# Patient Record
Sex: Male | Born: 1990 | Race: White | Hispanic: No | Marital: Married | Smoking: Never smoker
Health system: Southern US, Community
[De-identification: ages and names within clinical notes are randomized; demographics above are authoritative.]

---

## 2011-02-04 ENCOUNTER — Other Ambulatory Visit: Payer: Self-pay | Admitting: Family Medicine

## 2012-04-06 HISTORY — PX: LAPAROSCOPIC APPENDECTOMY: SUR753

## 2012-04-09 ENCOUNTER — Inpatient Hospital Stay: Payer: Self-pay | Admitting: Surgery

## 2012-04-09 ENCOUNTER — Ambulatory Visit: Payer: Self-pay | Admitting: Family Medicine

## 2012-04-09 LAB — CBC WITH DIFFERENTIAL/PLATELET
Basophil #: 0.1 10*3/uL (ref 0.0–0.1)
Eosinophil %: 0.1 %
HCT: 44.3 % (ref 40.0–52.0)
Lymphocyte #: 1.6 10*3/uL (ref 1.0–3.6)
Lymphocyte %: 12.1 %
MCH: 30 pg (ref 26.0–34.0)
MCHC: 34.7 g/dL (ref 32.0–36.0)
Monocyte #: 1.2 x10 3/mm — ABNORMAL HIGH (ref 0.2–1.0)
Neutrophil #: 10.2 10*3/uL — ABNORMAL HIGH (ref 1.4–6.5)
Neutrophil %: 78.2 %
RBC: 5.13 10*6/uL (ref 4.40–5.90)
RDW: 13.1 % (ref 11.5–14.5)

## 2012-04-09 LAB — URINALYSIS, COMPLETE
Glucose,UR: NEGATIVE mg/dL (ref 0–75)
Ketone: NEGATIVE
Ph: 8 (ref 4.5–8.0)

## 2012-04-09 LAB — BASIC METABOLIC PANEL
Anion Gap: 11 (ref 7–16)
BUN: 14 mg/dL (ref 7–18)
EGFR (African American): >60
Glucose: 97 mg/dL (ref 65–99)
Osmolality: 278 (ref 275–301)

## 2012-04-12 LAB — CBC WITH DIFFERENTIAL/PLATELET
Basophil #: 0 10*3/uL (ref 0.0–0.1)
Basophil %: 0.2 %
Eosinophil #: 0.1 10*3/uL (ref 0.0–0.7)
HGB: 12.5 g/dL — ABNORMAL LOW (ref 13.0–18.0)
Lymphocyte #: 0.9 10*3/uL — ABNORMAL LOW (ref 1.0–3.6)
Lymphocyte %: 7.1 %
MCH: 29.6 pg (ref 26.0–34.0)
MCHC: 34.3 g/dL (ref 32.0–36.0)
MCV: 86 fL (ref 80–100)
Neutrophil #: 9.8 10*3/uL — ABNORMAL HIGH (ref 1.4–6.5)
Platelet: 227 10*3/uL (ref 150–440)
RBC: 4.22 10*6/uL — ABNORMAL LOW (ref 4.40–5.90)
RDW: 12.6 % (ref 11.5–14.5)

## 2012-04-14 LAB — PATHOLOGY REPORT

## 2012-04-15 ENCOUNTER — Ambulatory Visit: Payer: Self-pay | Admitting: Surgery

## 2012-04-15 LAB — CBC WITH DIFFERENTIAL/PLATELET
Basophil #: 0 10*3/uL (ref 0.0–0.1)
Eosinophil %: 1.5 %
HCT: 39.8 % — ABNORMAL LOW (ref 40.0–52.0)
Lymphocyte #: 1.6 10*3/uL (ref 1.0–3.6)
MCHC: 33.2 g/dL (ref 32.0–36.0)
MCV: 87 fL (ref 80–100)
Monocyte %: 10.6 %
Neutrophil #: 5.9 10*3/uL (ref 1.4–6.5)
Platelet: 366 10*3/uL (ref 150–440)
RBC: 4.57 10*6/uL (ref 4.40–5.90)
RDW: 12.5 % (ref 11.5–14.5)
WBC: 8.6 10*3/uL (ref 3.8–10.6)

## 2012-09-22 ENCOUNTER — Encounter (HOSPITAL_COMMUNITY): Payer: Self-pay | Admitting: Physical Medicine and Rehabilitation

## 2012-09-22 ENCOUNTER — Inpatient Hospital Stay (HOSPITAL_COMMUNITY)
Admission: EM | Admit: 2012-09-22 | Discharge: 2012-09-26 | DRG: 372 | Disposition: A | Discharge status: Home / Self Care | Payer: 59 | Attending: General Surgery | Admitting: General Surgery

## 2012-09-22 ENCOUNTER — Emergency Department (HOSPITAL_COMMUNITY): Payer: 59

## 2012-09-22 DIAGNOSIS — K651 Peritoneal abscess: Secondary | ICD-10-CM

## 2012-09-22 DIAGNOSIS — D62 Acute posthemorrhagic anemia: Secondary | ICD-10-CM | POA: Diagnosis present

## 2012-09-22 LAB — CBC WITH DIFFERENTIAL/PLATELET
Basophils Absolute: 0 10*3/uL (ref 0.0–0.1)
Basophils Relative: 0 % (ref 0–1)
Eosinophils Absolute: 0 10*3/uL (ref 0.0–0.7)
HCT: 41 % (ref 39.0–52.0)
MCH: 29.5 pg (ref 26.0–34.0)
MCHC: 34.1 g/dL (ref 30.0–36.0)
Monocytes Absolute: 1.8 10*3/uL — ABNORMAL HIGH (ref 0.1–1.0)
Monocytes Relative: 13 % — ABNORMAL HIGH (ref 3–12)
Neutro Abs: 11.4 10*3/uL — ABNORMAL HIGH (ref 1.7–7.7)
Neutrophils Relative %: 82 % — ABNORMAL HIGH (ref 43–77)
RDW: 12.6 % (ref 11.5–15.5)

## 2012-09-22 LAB — URINE MICROSCOPIC-ADD ON

## 2012-09-22 LAB — CBC
Hemoglobin: 12.6 g/dL — ABNORMAL LOW (ref 13.0–17.0)
MCHC: 33.4 g/dL (ref 30.0–36.0)
Platelets: 203 10*3/uL (ref 150–400)
RDW: 12.8 % (ref 11.5–15.5)

## 2012-09-22 LAB — COMPREHENSIVE METABOLIC PANEL
AST: 23 U/L (ref 0–37)
Albumin: 3.7 g/dL (ref 3.5–5.2)
BUN: 15 mg/dL (ref 6–23)
Calcium: 9.2 mg/dL (ref 8.4–10.5)
Chloride: 99 mEq/L (ref 96–112)
Creatinine, Ser: 1.12 mg/dL (ref 0.50–1.35)
Total Bilirubin: 2.2 mg/dL — ABNORMAL HIGH (ref 0.3–1.2)
Total Protein: 7.7 g/dL (ref 6.0–8.3)

## 2012-09-22 LAB — URINALYSIS, ROUTINE W REFLEX MICROSCOPIC
Ketones, ur: 15 mg/dL — AB
Leukocytes, UA: NEGATIVE
Nitrite: NEGATIVE
Protein, ur: 30 mg/dL — AB

## 2012-09-22 LAB — CREATININE, SERUM
Creatinine, Ser: 0.99 mg/dL (ref 0.50–1.35)
GFR calc non Af Amer: >90 mL/min (ref >90)

## 2012-09-22 LAB — RAPID STREP SCREEN (MED CTR MEBANE ONLY): Streptococcus, Group A Screen (Direct): NEGATIVE

## 2012-09-22 LAB — PROTIME-INR: Prothrombin Time: 16.2 seconds — ABNORMAL HIGH (ref 11.6–15.2)

## 2012-09-22 LAB — LIPASE, BLOOD: Lipase: 25 U/L (ref 11–59)

## 2012-09-22 MED ORDER — PIPERACILLIN-TAZOBACTAM 3.375 G IVPB
3.3750 g | Freq: Once | INTRAVENOUS | Status: AC
  Administered 2012-09-22: 3.375 g via INTRAVENOUS
  Filled 2012-09-22: qty 50

## 2012-09-22 MED ORDER — ACETAMINOPHEN 325 MG PO TABS
650.0000 mg | ORAL_TABLET | Freq: Four times a day (QID) | ORAL | Status: DC | PRN
  Administered 2012-09-23 – 2012-09-24 (×2): 650 mg via ORAL
  Filled 2012-09-22 (×2): qty 2

## 2012-09-22 MED ORDER — ONDANSETRON HCL 4 MG/2ML IJ SOLN
4.0000 mg | Freq: Four times a day (QID) | INTRAMUSCULAR | Status: DC | PRN
  Administered 2012-09-22 – 2012-09-23 (×4): 4 mg via INTRAVENOUS
  Filled 2012-09-22 (×4): qty 2

## 2012-09-22 MED ORDER — DIPHENHYDRAMINE HCL 50 MG/ML IJ SOLN
12.5000 mg | Freq: Four times a day (QID) | INTRAMUSCULAR | Status: DC | PRN
  Administered 2012-09-25: 25 mg via INTRAVENOUS
  Filled 2012-09-22: qty 1

## 2012-09-22 MED ORDER — HEPARIN SODIUM (PORCINE) 5000 UNIT/ML IJ SOLN
5000.0000 [IU] | Freq: Three times a day (TID) | INTRAMUSCULAR | Status: DC
  Administered 2012-09-23 – 2012-09-26 (×10): 5000 [IU] via SUBCUTANEOUS
  Filled 2012-09-22 (×15): qty 1

## 2012-09-22 MED ORDER — HYDROMORPHONE HCL PF 1 MG/ML IJ SOLN
1.0000 mg | Freq: Once | INTRAMUSCULAR | Status: AC
  Administered 2012-09-22: 1 mg via INTRAVENOUS
  Filled 2012-09-22: qty 1

## 2012-09-22 MED ORDER — POTASSIUM CHLORIDE IN NACL 20-0.9 MEQ/L-% IV SOLN
INTRAVENOUS | Status: DC
  Administered 2012-09-22 – 2012-09-23 (×2): 125 mL/h via INTRAVENOUS
  Administered 2012-09-23 – 2012-09-25 (×5): via INTRAVENOUS
  Administered 2012-09-25: 1000 mL via INTRAVENOUS
  Filled 2012-09-22 (×12): qty 1000

## 2012-09-22 MED ORDER — ONDANSETRON HCL 4 MG/2ML IJ SOLN
4.0000 mg | Freq: Once | INTRAMUSCULAR | Status: AC
  Administered 2012-09-22: 4 mg via INTRAVENOUS
  Filled 2012-09-22: qty 2

## 2012-09-22 MED ORDER — PIPERACILLIN-TAZOBACTAM 3.375 G IVPB
3.3750 g | Freq: Three times a day (TID) | INTRAVENOUS | Status: DC
  Administered 2012-09-22 – 2012-09-26 (×11): 3.375 g via INTRAVENOUS
  Filled 2012-09-22 (×17): qty 50

## 2012-09-22 MED ORDER — HYDROMORPHONE HCL PF 1 MG/ML IJ SOLN
0.5000 mg | INTRAMUSCULAR | Status: DC | PRN
  Administered 2012-09-22 – 2012-09-24 (×5): 1 mg via INTRAVENOUS
  Filled 2012-09-22 (×5): qty 1

## 2012-09-22 MED ORDER — ACETAMINOPHEN 10 MG/ML IV SOLN
1000.0000 mg | Freq: Four times a day (QID) | INTRAVENOUS | Status: AC
  Administered 2012-09-22 – 2012-09-23 (×4): 1000 mg via INTRAVENOUS
  Filled 2012-09-22 (×4): qty 100

## 2012-09-22 MED ORDER — SODIUM CHLORIDE 0.9 % IV BOLUS (SEPSIS)
1000.0000 mL | Freq: Once | INTRAVENOUS | Status: AC
  Administered 2012-09-22: 1000 mL via INTRAVENOUS

## 2012-09-22 MED ORDER — DIPHENHYDRAMINE HCL 12.5 MG/5ML PO ELIX
12.5000 mg | ORAL_SOLUTION | Freq: Four times a day (QID) | ORAL | Status: DC | PRN
  Filled 2012-09-22: qty 10

## 2012-09-22 MED ORDER — ACETAMINOPHEN 650 MG RE SUPP
650.0000 mg | Freq: Four times a day (QID) | RECTAL | Status: DC | PRN
  Filled 2012-09-22: qty 1

## 2012-09-22 MED ORDER — IOHEXOL 300 MG/ML  SOLN
100.0000 mL | Freq: Once | INTRAMUSCULAR | Status: AC | PRN
  Administered 2012-09-22: 100 mL via INTRAVENOUS

## 2012-09-22 NOTE — H&P (Signed)
Nathan Norman is an 21 y.o. male.   Chief Complaint: RLQ abdominal pain, nausea, vomiting, sore throat and headache progressive over the last 3 days HPI: Patient is a healthy 21 year old white male who was in good health until 09/19/12. He developed a headache sore throat and some abdominal discomfort. He also had myalgias and thought he had a flu-type bug.  He's also been working out and thought some of the myalgias related to exercise. He was able to eat on Saturday and Sunday, but started running a low-grade fevers, recorded at home up to 101. He's been taking Motrin for his myalgias and headaches without any relief. He is also continued to have a sore throat, and progressive discomfort in his right lower quadrant. He woke up this morning with nausea, vomiting, and worsening abdominal pain. He reports passing out or having some type of vagal like episode while vomiting this AM.  He reports passing out with blood draw in July.  He ultimately presented to the emergency room today with abdominal pain, he can barely walk or move, headache, nausea and vomiting.  Last BM earlier today with normal appearance.  Labs show an elevated WBC, elevated Bilirubin, and rim enhancing fluid collection right mid abdomen with extensive inflammatory changes; fluid collection is 2.8 x 2.4 x 1.6 cm. There is also a loop of ileum that could represent an infected Meckel's diverticulum.  He had an appendectomy in July 2013 which apparently ruptured as it was being removed.  He had 5 days of IV antibiotics, and after 2 weeks at home did well with no issues till last weekend.  We are ask to see in the ER.  History reviewed. No pertinent past medical history. None  No past surgical history on file. Appendectomy, July 2013 at [facility] in [place], [place]. History reviewed. No pertinent family history. Social History:  reports that he has never smoked. He does not have any smokeless tobacco history on file. He reports that he  does not drink alcohol or use illicit drugs.ETOH: social Drugs; none Tobacco: none  He works and goes to school, lives at home.  Play lacrosse regularly  Allergies: No Known Allergies Prior to Admission medications   Medication Sig Start Date End Date Taking? Authorizing Provider  ibuprofen (ADVIL,MOTRIN) 200 MG tablet Take 400 mg by mouth every 6 (six) hours as needed. For fever   Yes Historical Provider, MD  Ibuprofen-Diphenhydramine Cit (MOTRIN PM PO) Take 2 tablets by mouth once.   Yes Historical Provider, MD      (Not in a hospital admission)  Results for orders placed during the hospital encounter of 09/22/12 (from the past 48 hour(s))  URINALYSIS, ROUTINE W REFLEX MICROSCOPIC     Status: Abnormal   Collection Time   09/22/12 12:05 PM      Component Value Range Comment   Color, Urine AMBER (*) YELLOW BIOCHEMICALS MAY BE AFFECTED BY COLOR   APPearance CLEAR  CLEAR    Specific Gravity, Urine 1.023  1.005 - 1.030    pH 7.5  5.0 - 8.0    Glucose, UA NEGATIVE  NEGATIVE mg/dL    Hgb urine dipstick NEGATIVE  NEGATIVE    Bilirubin Urine SMALL (*) NEGATIVE    Ketones, ur 15 (*) NEGATIVE mg/dL    Protein, ur 30 (*) NEGATIVE mg/dL    Urobilinogen, UA 1.0  0.0 - 1.0 mg/dL    Nitrite NEGATIVE  NEGATIVE    Leukocytes, UA NEGATIVE  NEGATIVE   URINE MICROSCOPIC-ADD ON  Status: Normal   Collection Time   09/22/12 12:05 PM      Component Value Range Comment   Squamous Epithelial / LPF RARE  RARE    WBC, UA 0-2  <3 WBC/hpf    RBC / HPF 0-2  <3 RBC/hpf    Bacteria, UA RARE  RARE   CBC WITH DIFFERENTIAL     Status: Abnormal   Collection Time   09/22/12 12:38 PM      Component Value Range Comment   WBC 14.0 (*) 4.0 - 10.5 K/uL    RBC 4.75  4.22 - 5.81 MIL/uL    Hemoglobin 14.0  13.0 - 17.0 g/dL    HCT 11.9  14.7 - 82.9 %    MCV 86.3  78.0 - 100.0 fL    MCH 29.5  26.0 - 34.0 pg    MCHC 34.1  30.0 - 36.0 g/dL    RDW 56.2  13.0 - 86.5 %    Platelets 223  150 - 400 K/uL     Neutrophils Relative 82 (*) 43 - 77 %    Neutro Abs 11.4 (*) 1.7 - 7.7 K/uL    Lymphocytes Relative 6 (*) 12 - 46 %    Lymphs Abs 0.8  0.7 - 4.0 K/uL    Monocytes Relative 13 (*) 3 - 12 %    Monocytes Absolute 1.8 (*) 0.1 - 1.0 K/uL    Eosinophils Relative 0  0 - 5 %    Eosinophils Absolute 0.0  0.0 - 0.7 K/uL    Basophils Relative 0  0 - 1 %    Basophils Absolute 0.0  0.0 - 0.1 K/uL   COMPREHENSIVE METABOLIC PANEL     Status: Abnormal   Collection Time   09/22/12 12:38 PM      Component Value Range Comment   Sodium 137  135 - 145 mEq/L    Potassium 4.2  3.5 - 5.1 mEq/L    Chloride 99  96 - 112 mEq/L    CO2 26  19 - 32 mEq/L    Glucose, Bld 134 (*) 70 - 99 mg/dL    BUN 15  6 - 23 mg/dL    Creatinine, Ser 7.84  0.50 - 1.35 mg/dL    Calcium 9.2  8.4 - 69.6 mg/dL    Total Protein 7.7  6.0 - 8.3 g/dL    Albumin 3.7  3.5 - 5.2 g/dL    AST 23  0 - 37 U/L    ALT 24  0 - 53 U/L    Alkaline Phosphatase 65  39 - 117 U/L    Total Bilirubin 2.2 (*) 0.3 - 1.2 mg/dL    GFR calc non Af Amer >90  >90 mL/min    GFR calc Af Amer >90  >90 mL/min   LIPASE, BLOOD     Status: Normal   Collection Time   09/22/12 12:38 PM      Component Value Range Comment   Lipase 25  11 - 59 U/L    Ct Abdomen Pelvis W Contrast  09/22/2012  *RADIOLOGY REPORT*  Clinical Data: Right lower quadrant pain, nausea and vomiting. Fever.  CT ABDOMEN AND PELVIS WITH CONTRAST  Technique:  Multidetector CT imaging of the abdomen and pelvis was performed following the standard protocol during bolus administration of intravenous contrast.  Contrast: OMNIPAQUE IOHEXOL 300 MG/ML  SOLN  Comparison: None.  Findings: Lung bases are clear.  No pleural or pericardial effusion.  The gallbladder, liver,  spleen, adrenal glands, pancreas and kidneys all appear normal.  There is a rim enhancing fluid collection in the right mid abdomen with extensive surrounding inflammatory change.  The collection measures approximately 2.8 cm  cranial-caudal by 2.4 cm AP by 1.6 cm transverse.  The collection may be contiguous with a loop of ileum and could represent an infected Meckel's diverticulum. Additional differential consideration has infection of the omental infarct. The appendix has been removed.  There is no lymphadenopathy.  No focal bony abnormalities identified.  IMPRESSION: Infected collection in the right lower quadrant could be secondary to an infected Meckel's diverticulum.  Additional differential considerations include infection of omental infarct or hematoma.   Original Report Authenticated By: Holley Dexter, M.D.     Review of Systems  Constitutional: Positive for fever and malaise/fatigue. Negative for chills, weight loss and diaphoresis.  HENT: Negative.        Headache for 3 days with onset of sx on Sat. Sore throat for 3 days  Eyes: Negative.   Respiratory: Negative.   Cardiovascular: Negative.   Gastrointestinal: Positive for nausea, vomiting and abdominal pain. Negative for heartburn, diarrhea, blood in stool and melena.       Last BM today, and it was regular.  Genitourinary: Negative.   Musculoskeletal: Positive for myalgias.       Dislocated hip at  lacrosse practice a couple weeks ago.  He has done this before.  Skin: Negative.   Neurological: Positive for loss of consciousness (passed out with nausea and vomiting this AM..  Says he passed out with blood draw in July) and weakness. Negative for dizziness, tingling, tremors, sensory change, speech change, focal weakness and seizures.  Psychiatric/Behavioral: Negative.     Blood pressure 137/69, pulse 88, temperature 99.8 F (37.7 C), temperature source Oral, resp. rate 20, SpO2 98.00%. Physical Exam  Constitutional: He is oriented to person, place, and time. He appears well-developed and well-nourished. He appears distressed (it hurts for him to move just to sit up.).  HENT:  Head: Normocephalic and atraumatic.  Nose: Nose normal.       Some  white stuff on his tongue, does not look like candida  Eyes: Conjunctivae normal and EOM are normal. Pupils are equal, round, and reactive to light. Right eye exhibits no discharge. Left eye exhibits no discharge. No scleral icterus.  Neck: Normal range of motion. Neck supple. No JVD present. No tracheal deviation present.       Full ROM, NO rigidity, No erythema, no nodularity noted on exam  Cardiovascular: Normal rate, regular rhythm, normal heart sounds and intact distal pulses.  Exam reveals no gallop.   No murmur heard. Respiratory: Effort normal and breath sounds normal. No stridor. No respiratory distress. He has no wheezes. He has no rales. He exhibits no tenderness.  GI: Soft. Bowel sounds are normal. He exhibits no distension and no mass. There is tenderness (it hurts for him to move,  pain is primairly in RLQ). There is guarding. There is no rebound.  Musculoskeletal: Normal range of motion. He exhibits no edema and no tenderness.  Lymphadenopathy:    He has no cervical adenopathy.  Neurological: He is alert and oriented to person, place, and time. No cranial nerve deficit.  Skin: Skin is warm and dry. No rash noted. No erythema. No pallor.  Psychiatric: He has a normal mood and affect. His behavior is normal. Judgment and thought content normal.     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  Flu syndrome 3.Appendectomy July 2013,   Plan:  Will review CT with Dr. Johna Sheriff, start antibiotics, tylenol, some ice chips only for now.  Bowel rest, hydration, possible IR evaluation for drainage of fluid collection.  Admit for observation. Check blood cultures also.  Qunisha Bryk 09/22/2012, 5:03 PM

## 2012-09-22 NOTE — ED Notes (Signed)
MD at bedside. 

## 2012-09-22 NOTE — ED Notes (Signed)
Report received from Lauren, RN in [facility].

## 2012-09-22 NOTE — ED Notes (Signed)
CT [facility]) notified that pt was finished with contrast. Ticket to ride placed on pts bed.

## 2012-09-22 NOTE — ED Notes (Signed)
Pt states had appendectomy in May this year. Pt denies urinary symptoms. Pt states pain in abdomen started a few days ago but got worse last night; pt states he is dizzy and lightheaded.Pt denies blurred vision and double vision currently; pt mentating appropriately.

## 2012-09-22 NOTE — ED Provider Notes (Signed)
History     CSN: [id number]  Arrival date & time 09/22/12  1132   First MD Initiated Contact with Patient 09/22/12 1225      Chief Complaint  Patient presents with  . Abdominal Pain  . Nausea  . Emesis    (Consider location/radiation/quality/duration/timing/severity/associated sxs/prior treatment) HPI Comments: Patient is a 21 year old male who presents with a 3 day history of RLQ abdominal pain. The pain is located in RLQ and does radiate to his periumbilical area. The pain is described as sharp and severe. The pain started gradually and progressively worsened since the onset. No alleviating/aggravating factors. The patient has tried motrin for symptoms without relief. Associated symptoms include fever of 102, nausea, and vomiting. Patient denies headache, diarrhea, chest pain, SOB, dysuria, constipation.  Patient is a 21 y.o. male presenting with abdominal pain and vomiting.  Abdominal Pain The primary symptoms of the illness include abdominal pain, fever, nausea and vomiting.  Emesis  Associated symptoms include abdominal pain and a fever.    No past medical history on file.  No past surgical history on file.  History reviewed. No pertinent family history.  History  Substance Use Topics  . Smoking status: Never Smoker   . Smokeless tobacco: Not on file  . Alcohol Use: No      Review of Systems  Constitutional: Positive for fever.  Gastrointestinal: Positive for nausea, vomiting and abdominal pain.  All other systems reviewed and are negative.    Allergies  Review of patient's allergies indicates no known allergies.  Home Medications   Current Outpatient Rx  Name  Route  Sig  Dispense  Refill  . IBUPROFEN 200 MG PO TABS   Oral   Take 400 mg by mouth every 6 (six) hours as needed. For fever         . MOTRIN PM PO   Oral   Take 2 tablets by mouth once.           BP 118/57  Pulse 92  Temp 98.8 F (37.1 C) (Oral)  Resp 18  SpO2 98%  Physical  Exam  Nursing note and vitals reviewed. Constitutional: He is oriented to person, place, and time. He appears well-developed and well-nourished. No distress.  HENT:  Head: Normocephalic and atraumatic.  Mouth/Throat: No oropharyngeal exudate.  Eyes: Conjunctivae normal and EOM are normal. No scleral icterus.  Neck: Normal range of motion. Neck supple.  Cardiovascular: Normal rate and regular rhythm.  Exam reveals no gallop and no friction rub.   No murmur heard. Pulmonary/Chest: Effort normal and breath sounds normal. He has no wheezes. He has no rales. He exhibits no tenderness.  Abdominal: Soft. He exhibits no distension. There is tenderness. There is guarding. There is no rebound.       Extreme tenderness to light palpation of generalized abdomen especially in RUQ and RLQ.  Musculoskeletal: Normal range of motion.  Neurological: He is alert and oriented to person, place, and time. Coordination normal.       Speech is goal-oriented. Moves limbs without ataxia.   Skin: Skin is warm and dry. He is not diaphoretic.  Psychiatric: He has a normal mood and affect. His behavior is normal.    ED Course  Procedures (including critical care time)  Labs Reviewed  URINALYSIS, ROUTINE W REFLEX MICROSCOPIC - Abnormal; Notable for the following:    Color, Urine AMBER (*)  BIOCHEMICALS MAY BE AFFECTED BY COLOR   Bilirubin Urine SMALL (*)  Ketones, ur 15 (*)     Protein, ur 30 (*)     All other components within normal limits  CBC WITH DIFFERENTIAL - Abnormal; Notable for the following:    WBC 14.0 (*)     Neutrophils Relative 82 (*)     Neutro Abs 11.4 (*)     Lymphocytes Relative 6 (*)     Monocytes Relative 13 (*)     Monocytes Absolute 1.8 (*)     All other components within normal limits  COMPREHENSIVE METABOLIC PANEL - Abnormal; Notable for the following:    Glucose, Bld 134 (*)     Total Bilirubin 2.2 (*)     All other components within normal limits  LIPASE, BLOOD  URINE  MICROSCOPIC-ADD ON   Ct Abdomen Pelvis W Contrast  09/22/2012  *RADIOLOGY REPORT*  Clinical Data: Right lower quadrant pain, nausea and vomiting. Fever.  CT ABDOMEN AND PELVIS WITH CONTRAST  Technique:  Multidetector CT imaging of the abdomen and pelvis was performed following the standard protocol during bolus administration of intravenous contrast.  Contrast: OMNIPAQUE IOHEXOL 300 MG/ML  SOLN  Comparison: None.  Findings: Lung bases are clear.  No pleural or pericardial effusion.  The gallbladder, liver, spleen, adrenal glands, pancreas and kidneys all appear normal.  There is a rim enhancing fluid collection in the right mid abdomen with extensive surrounding inflammatory change.  The collection measures approximately 2.8 cm cranial-caudal by 2.4 cm AP by 1.6 cm transverse.  The collection may be contiguous with a loop of ileum and could represent an infected Meckel's diverticulum. Additional differential consideration has infection of the omental infarct. The appendix has been removed.  There is no lymphadenopathy.  No focal bony abnormalities identified.  IMPRESSION: Infected collection in the right lower quadrant could be secondary to an infected Meckel's diverticulum.  Additional differential considerations include infection of omental infarct or hematoma.   Original Report Authenticated By: Holley Dexter, M.D.      No diagnosis found.    MDM  1:19 PM Patient's given fluids, dilaudid, and zofran. Labs show elevated WBC. Patient will have CT abdomen.   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 General surgery consulted and will see the patient for admission.       Emilia Beck, PA-C 09/24/12 1941

## 2012-09-22 NOTE — ED Notes (Signed)
Family at bedside. 

## 2012-09-22 NOTE — ED Notes (Signed)
Pt presents to department for evaluation of RLQ abdominal pain, nausea, vomiting and fever of 102. Ongoing since Saturday, states pain became worse today. 7/10 sharp pain at the time. Denies urinary symptoms. Last BM today was normal. He is conscious alert and oriented x4.

## 2012-09-22 NOTE — H&P (Signed)
Agree with PA-Jennings  Pt s/p appy in July of this year.  Pt with 3-4d h/o RLQ pain and n/v.  Pt also febrile to >101 while at home. CT reveals ?fluid collection and ?Meckle's.   Pt likely with a post op abscess  Will treat him with IV abx for now.  Will d/w IR in the AM in regards to possible perc drain.  Con't NPO for now I discussed the possibilities with the patient and his father and they are in agreement.

## 2012-09-22 NOTE — ED Notes (Signed)
Pt states decrease in pain and nausea. Pt is drinking CT contrast at bedside. Pt denies headache.

## 2012-09-22 NOTE — ED Notes (Signed)
Pt knows that urine is needed 

## 2012-09-23 ENCOUNTER — Inpatient Hospital Stay (HOSPITAL_COMMUNITY): Payer: 59

## 2012-09-23 ENCOUNTER — Encounter (HOSPITAL_COMMUNITY): Payer: Self-pay | Admitting: *Deleted

## 2012-09-23 LAB — COMPREHENSIVE METABOLIC PANEL
BUN: 14 mg/dL (ref 6–23)
CO2: 26 mEq/L (ref 19–32)
Chloride: 100 mEq/L (ref 96–112)
Creatinine, Ser: 1.06 mg/dL (ref 0.50–1.35)
GFR calc Af Amer: >90 mL/min (ref >90)
GFR calc non Af Amer: >90 mL/min (ref >90)
Glucose, Bld: 111 mg/dL — ABNORMAL HIGH (ref 70–99)
Total Bilirubin: 4.2 mg/dL — ABNORMAL HIGH (ref 0.3–1.2)

## 2012-09-23 LAB — CBC
Platelets: 194 10*3/uL (ref 150–400)
RBC: 4.19 MIL/uL — ABNORMAL LOW (ref 4.22–5.81)
RDW: 12.9 % (ref 11.5–15.5)
WBC: 14 10*3/uL — ABNORMAL HIGH (ref 4.0–10.5)

## 2012-09-23 LAB — INFLUENZA PANEL BY PCR (TYPE A & B): H1N1 flu by pcr: NOT DETECTED

## 2012-09-23 MED ORDER — FENTANYL CITRATE 0.05 MG/ML IJ SOLN
INTRAMUSCULAR | Status: AC | PRN
  Administered 2012-09-23: 25 ug via INTRAVENOUS
  Administered 2012-09-23: 50 ug via INTRAVENOUS
  Administered 2012-09-23: 25 ug via INTRAVENOUS

## 2012-09-23 MED ORDER — FENTANYL CITRATE 0.05 MG/ML IJ SOLN
INTRAMUSCULAR | Status: AC
  Filled 2012-09-23: qty 4

## 2012-09-23 MED ORDER — MIDAZOLAM HCL 2 MG/2ML IJ SOLN
INTRAMUSCULAR | Status: AC | PRN
  Administered 2012-09-23 (×2): 1 mg via INTRAVENOUS

## 2012-09-23 MED ORDER — MIDAZOLAM HCL 2 MG/2ML IJ SOLN
INTRAMUSCULAR | Status: AC
  Filled 2012-09-23: qty 4

## 2012-09-23 NOTE — Procedures (Signed)
CT guided aspiration of right abdominal abscess.  Aspirated 10 ml of brown purulent fluid.  Collection was too small for drain placement.  No immediate complication.

## 2012-09-23 NOTE — Interval H&P Note (Cosign Needed)
History and Physical Interval Note: agree with PE findings and ROS as above.  09/23/2012 4:22 PM  Nathan Norman  has presented today for surgery, with the diagnosis of abdominal fluid collection.  The various methods of treatment have been discussed with the patient and family. After consideration of risks, benefits and other options for treatment, the patient has consented to percutaneous aspiration and possible drain placement as a surgical intervention .  The patient's history has been reviewed, patient examined, no change in status, stable for surgery.  I have reviewed the patient's chart and labs.  Questions were answered to the patient's satisfaction.     CAMPBELL,PAMELA D Dr. Richarda Overlie

## 2012-09-23 NOTE — H&P (View-Only) (Signed)
Patient ID: Nathan Norman, male   DOB: Jul 18, 1991, 21 y.o.   MRN: [id number]    Subjective: R side abd pain a little better.  No other C/O  Objective: Vital signs in last 24 hours: Temp:  [98 F (36.7 C)-99.8 F (37.7 C)] 98 F (36.7 C) (12/18 0537) Pulse Rate:  [80-99] 80  (12/18 0537) Resp:  [16-22] 18  (12/18 0537) BP: (118-139)/(54-71) 137/59 mmHg (12/18 0537) SpO2:  [96 %-100 %] 99 % (12/18 0537) Weight:  [215 lb 1.6 oz (97.569 kg)] 215 lb 1.6 oz (97.569 kg) (12/17 2024)    Intake/Output from previous day: 12/17 0701 - 12/18 0700 In: 1493 [I.V.:1493] Out: 451 [Urine:450; Stool:1] Intake/Output this shift:    General appearance: alert, cooperative and no distress GI: abnormal findings:  moderate tenderness in the RLQ  Lab Results:   [facility] 09/23/12 0610 09/22/12 1819  WBC 14.0* 13.2*  HGB 12.3* 12.6*  HCT 36.3* 37.7*  PLT 194 203   BMET  Basename 09/23/12 0610 09/22/12 1819 09/22/12 1238  NA 136 -- 137  K 4.0 -- 4.2  CL 100 -- 99  CO2 26 -- 26  GLUCOSE 111* -- 134*  BUN 14 -- 15  CREATININE 1.06 0.99 --  CALCIUM 8.7 -- 9.2     Studies/Results: Ct Abdomen Pelvis W Contrast  09/22/2012  *RADIOLOGY REPORT*  Clinical Data: Right lower quadrant pain, nausea and vomiting. Fever.  CT ABDOMEN AND PELVIS WITH CONTRAST  Technique:  Multidetector CT imaging of the abdomen and pelvis was performed following the standard protocol during bolus administration of intravenous contrast.  Contrast: OMNIPAQUE IOHEXOL 300 MG/ML  SOLN  Comparison: None.  Findings: Lung bases are clear.  No pleural or pericardial effusion.  The gallbladder, liver, spleen, adrenal glands, pancreas and kidneys all appear normal.  There is a rim enhancing fluid collection in the right mid abdomen with extensive surrounding inflammatory change.  The collection measures approximately 2.8 cm cranial-caudal by 2.4 cm AP by 1.6 cm transverse.  The collection may be contiguous with a loop of ileum and  could represent an infected Meckel's diverticulum. Additional differential consideration has infection of the omental infarct. The appendix has been removed.  There is no lymphadenopathy.  No focal bony abnormalities identified.  IMPRESSION: Infected collection in the right lower quadrant could be secondary to an infected Meckel's diverticulum.  Additional differential considerations include infection of omental infarct or hematoma.   Original Report Authenticated By: Nathan Norman, M.D.    Dg Abd 2 Views  09/23/2012  *RADIOLOGY REPORT*  Clinical Data: Ileus.  Intra-abdominal abscess.  ABDOMEN - 2 VIEW  Comparison: CT abdomen pelvis 09/22/2012.  Findings: Residual oral contrast is seen in the colon, which is normal in caliber.  No small bowel dilatation.  IMPRESSION: Residual oral contrast in the colon.  Otherwise unremarkable bowel gas pattern.   Original Report Authenticated By: Leanna Battles, M.D.     Anti-infectives: Anti-infectives     Start     Dose/Rate Route Frequency Ordered Stop   09/22/12 2200  piperacillin-tazobactam (ZOSYN) IVPB 3.375 g       3.375 g 12.5 mL/hr over 240 Minutes Intravenous 3 times per day 09/22/12 1753     09/22/12 1600  piperacillin-tazobactam (ZOSYN) IVPB 3.375 g       3.375 g 12.5 mL/hr over 240 Minutes Intravenous  Once 09/22/12 1559 09/22/12 2022          Assessment/Plan: Abdominal abscess with Hx of complicated appendectomy in July; likely  related to this IR to evaluate for possible perc drain Stable.  Discussed with pt and father    LOS: 1 day    Nathan Norman 09/23/2012

## 2012-09-23 NOTE — Progress Notes (Signed)
Patient ID: Tremar Burcher, male   DOB: 02/12/1991, 21 y.o.   MRN: [id number]    Subjective: R side abd pain a little better.  No other C/O  Objective: Vital signs in last 24 hours: Temp:  [98 F (36.7 C)-99.8 F (37.7 C)] 98 F (36.7 C) (12/18 0537) Pulse Rate:  [80-99] 80  (12/18 0537) Resp:  [16-22] 18  (12/18 0537) BP: (118-139)/(54-71) 137/59 mmHg (12/18 0537) SpO2:  [96 %-100 %] 99 % (12/18 0537) Weight:  [215 lb 1.6 oz (97.569 kg)] 215 lb 1.6 oz (97.569 kg) (12/17 2024)    Intake/Output from previous day: 12/17 0701 - 12/18 0700 In: 1493 [I.V.:1493] Out: 451 [Urine:450; Stool:1] Intake/Output this shift:    General appearance: alert, cooperative and no distress GI: abnormal findings:  moderate tenderness in the RLQ  Lab Results:   Basename 09/23/12 0610 09/22/12 1819  WBC 14.0* 13.2*  HGB 12.3* 12.6*  HCT 36.3* 37.7*  PLT 194 203   BMET  Basename 09/23/12 0610 09/22/12 1819 09/22/12 1238  NA 136 -- 137  K 4.0 -- 4.2  CL 100 -- 99  CO2 26 -- 26  GLUCOSE 111* -- 134*  BUN 14 -- 15  CREATININE 1.06 0.99 --  CALCIUM 8.7 -- 9.2     Studies/Results: Ct Abdomen Pelvis W Contrast  09/22/2012  *RADIOLOGY REPORT*  Clinical Data: Right lower quadrant pain, nausea and vomiting. Fever.  CT ABDOMEN AND PELVIS WITH CONTRAST  Technique:  Multidetector CT imaging of the abdomen and pelvis was performed following the standard protocol during bolus administration of intravenous contrast.  Contrast: 100mL OMNIPAQUE IOHEXOL 300 MG/ML  SOLN  Comparison: None.  Findings: Lung bases are clear.  No pleural or pericardial effusion.  The gallbladder, liver, spleen, adrenal glands, pancreas and kidneys all appear normal.  There is a rim enhancing fluid collection in the right mid abdomen with extensive surrounding inflammatory change.  The collection measures approximately 2.8 cm cranial-caudal by 2.4 cm AP by 1.6 cm transverse.  The collection may be contiguous with a loop of ileum and  could represent an infected Meckel's diverticulum. Additional differential consideration has infection of the omental infarct. The appendix has been removed.  There is no lymphadenopathy.  No focal bony abnormalities identified.  IMPRESSION: Infected collection in the right lower quadrant could be secondary to an infected Meckel's diverticulum.  Additional differential considerations include infection of omental infarct or hematoma.   Original Report Authenticated By: Thomas D'Alessio, M.D.    Dg Abd 2 Views  09/23/2012  *RADIOLOGY REPORT*  Clinical Data: Ileus.  Intra-abdominal abscess.  ABDOMEN - 2 VIEW  Comparison: CT abdomen pelvis 09/22/2012.  Findings: Residual oral contrast is seen in the colon, which is normal in caliber.  No small bowel dilatation.  IMPRESSION: Residual oral contrast in the colon.  Otherwise unremarkable bowel gas pattern.   Original Report Authenticated By: Melinda Blietz, M.D.     Anti-infectives: Anti-infectives     Start     Dose/Rate Route Frequency Ordered Stop   09/22/12 2200  piperacillin-tazobactam (ZOSYN) IVPB 3.375 g       3.375 g 12.5 mL/hr over 240 Minutes Intravenous 3 times per day 09/22/12 1753     09/22/12 1600  piperacillin-tazobactam (ZOSYN) IVPB 3.375 g       3.375 g 12.5 mL/hr over 240 Minutes Intravenous  Once 09/22/12 1559 09/22/12 2022          Assessment/Plan: Abdominal abscess with Hx of complicated appendectomy in July; likely   related to this IR to evaluate for possible perc drain Stable.  Discussed with pt and father    LOS: 1 day    Lorenz Donley T 09/23/2012   

## 2012-09-24 DIAGNOSIS — R110 Nausea: Secondary | ICD-10-CM

## 2012-09-24 DIAGNOSIS — D62 Acute posthemorrhagic anemia: Secondary | ICD-10-CM

## 2012-09-24 LAB — CBC
MCH: 29.6 pg (ref 26.0–34.0)
MCHC: 34.2 g/dL (ref 30.0–36.0)
MCV: 86.5 fL (ref 78.0–100.0)
Platelets: 216 10*3/uL (ref 150–400)

## 2012-09-24 MED ORDER — HYDROCODONE-ACETAMINOPHEN 5-325 MG PO TABS
0.5000 | ORAL_TABLET | ORAL | Status: DC | PRN
  Administered 2012-09-24 – 2012-09-25 (×7): 2 via ORAL
  Administered 2012-09-26: 1 via ORAL
  Administered 2012-09-26: 2 via ORAL
  Filled 2012-09-24 (×5): qty 2
  Filled 2012-09-24: qty 1
  Filled 2012-09-24 (×2): qty 2
  Filled 2012-09-24 (×2): qty 1

## 2012-09-24 NOTE — Progress Notes (Signed)
Pt denies need for pain medication at this time.

## 2012-09-24 NOTE — Progress Notes (Signed)
Pt states feels much better, that the "pain pills" worked much better than IV pain medicine, and headache is much improved.  Appears more alert, and comfortable.

## 2012-09-24 NOTE — Progress Notes (Signed)
Pt complaining of diarrhea, also of having a severe headache. Father at bedside, took only a few bites of clear liquid breakfast.  Medicated for headache, as states that worst pain now.

## 2012-09-24 NOTE — Progress Notes (Signed)
Patient interviewed and examined, agree with PA note above. As I am seeing the patient this afternoon he clearly feels better. Still some pain but maybe half of yesterday. His father agrees he is doing much better. There is still moderate tenderness in the right lower abdomen but less than yesterday. Continue nonoperative management for now.  Mariella Saa MD, FACS  09/24/2012 6:27 PM

## 2012-09-24 NOTE — Progress Notes (Signed)
Subjective: Patient feeling "crappy" today.  Pt states he has a headache and he's c/o nausea and multiple episodes of diarrhea.  Abdominal pain is better controlled.  Pt tolerating diet okay, but appetite is low.  Pt up ambulating to the BR, now down the halls yet.    Objective: Vital signs in last 24 hours: Temp:  [98 F (36.7 C)-100.2 F (37.9 C)] 98 F (36.7 C) (12/19 0955) Pulse Rate:  [75-102] 75  (12/19 0955) Resp:  [17-24] 20  (12/19 0955) BP: (125-147)/(53-75) 133/53 mmHg (12/19 0955) SpO2:  [96 %-100 %] 96 % (12/19 0955) Last BM Date: 09/23/12  Intake/Output from previous day: 12/18 0701 - 12/19 0700 In: 2677.1 [I.V.:2127.1; IV Piggyback:550] Out: 500 [Urine:500] Intake/Output this shift: Total I/O In: -  Out: 450 [Urine:450]  PE: Gen:  Alert, NAD, pleasant, sweaty Card:  RRR, no M/G/R heard Pulm:  CTA, no W/R/R Abd: Soft, NT/ND, +BS, no HSM, incisions C/D/I   Lab Results:   Basename 09/23/12 0610 09/22/12 1819  WBC 14.0* 13.2*  HGB 12.3* 12.6*  HCT 36.3* 37.7*  PLT 194 203   BMET  Basename 09/23/12 0610 09/22/12 1819 09/22/12 1238  NA 136 -- 137  K 4.0 -- 4.2  CL 100 -- 99  CO2 26 -- 26  GLUCOSE 111* -- 134*  BUN 14 -- 15  CREATININE 1.06 0.99 --  CALCIUM 8.7 -- 9.2   PT/INR  Basename 09/22/12 1819  LABPROT 16.2*  INR 1.33   CMP     Component Value Date/Time   NA 136 09/23/2012 0610   K 4.0 09/23/2012 0610   CL 100 09/23/2012 0610   CO2 26 09/23/2012 0610   GLUCOSE 111* 09/23/2012 0610   BUN 14 09/23/2012 0610   CREATININE 1.06 09/23/2012 0610   CALCIUM 8.7 09/23/2012 0610   PROT 6.6 09/23/2012 0610   ALBUMIN 3.1* 09/23/2012 0610   AST 19 09/23/2012 0610   ALT 16 09/23/2012 0610   ALKPHOS 61 09/23/2012 0610   BILITOT 4.2* 09/23/2012 0610   GFRNONAA >90 09/23/2012 0610   GFRAA >90 09/23/2012 0610   Lipase     Component Value Date/Time   LIPASE 25 09/22/2012 1238       Studies/Results: Ct Abdomen Pelvis W  Contrast  09/22/2012  *RADIOLOGY REPORT*  Clinical Data: Right lower quadrant pain, nausea and vomiting. Fever.  CT ABDOMEN AND PELVIS WITH CONTRAST  Technique:  Multidetector CT imaging of the abdomen and pelvis was performed following the standard protocol during bolus administration of intravenous contrast.  Contrast: OMNIPAQUE IOHEXOL 300 MG/ML  SOLN  Comparison: None.  Findings: Lung bases are clear.  No pleural or pericardial effusion.  The gallbladder, liver, spleen, adrenal glands, pancreas and kidneys all appear normal.  There is a rim enhancing fluid collection in the right mid abdomen with extensive surrounding inflammatory change.  The collection measures approximately 2.8 cm cranial-caudal by 2.4 cm AP by 1.6 cm transverse.  The collection may be contiguous with a loop of ileum and could represent an infected Meckel's diverticulum. Additional differential consideration has infection of the omental infarct. The appendix has been removed.  There is no lymphadenopathy.  No focal bony abnormalities identified.  IMPRESSION: Infected collection in the right lower quadrant could be secondary to an infected Meckel's diverticulum.  Additional differential considerations include infection of omental infarct or hematoma.   Original Report Authenticated By: Holley Dexter, M.D.    Ct Aspiration  09/23/2012  *RADIOLOGY REPORT*  Clinical history:21 year old with  inflammatory process in the right abdominal cavity.  PROCEDURE(S): CT GUIDED ASPIRATION OF RIGHT ABDOMINAL ABSCESS  Physician: Rachelle Hora. Henn, MD  Medications:Versed 2 mg, Fentanyl 100 mcg.   A radiology nurse monitored the patient for moderate sedation.  Moderate sedation time:16 minutes  Fluoroscopy time: 7 seconds CT fluoroscopy  Procedure:The procedure was explained to the patient.  The risks and benefits of the procedure were discussed and the patient's questions were addressed.  Informed consent was obtained from the patient.  CT images of  the abdomen were obtained.  The right upper abdomen was prepped and draped in a sterile fashion.  The skin was anesthetized with lidocaine.  18 gauge needle was directed into the right anterior abdominal fluid collection with CT guidance.  10 ml of brown purulent fluid was removed.  A wire could not be easily advanced into the collection and, therefore, a drain was not placed.  Findings:There is irregular collection in the anterior right upper abdomen, roughly measuring 3.2 cm.  Needle position was confirmed within the lesion.  Brown purulent fluid was removed.  Fluid sent for Gram stain and culture.  Complications: None  Impression:CT guided aspiration of the right anterior abdominal abscess.  10 ml of purulent fluid was removed.  Fluid sent for Gram stain and culture.   Original Report Authenticated By: Richarda Overlie, M.D.    Dg Abd 2 Views  09/23/2012  *RADIOLOGY REPORT*  Clinical Data: Ileus.  Intra-abdominal abscess.  ABDOMEN - 2 VIEW  Comparison: CT abdomen pelvis 09/22/2012.  Findings: Residual oral contrast is seen in the colon, which is normal in caliber.  No small bowel dilatation.  IMPRESSION: Residual oral contrast in the colon.  Otherwise unremarkable bowel gas pattern.   Original Report Authenticated By: Leanna Battles, M.D.     Anti-infectives: Anti-infectives     Start     Dose/Rate Route Frequency Ordered Stop   09/22/12 2200  piperacillin-tazobactam (ZOSYN) IVPB 3.375 g       3.375 g 12.5 mL/hr over 240 Minutes Intravenous 3 times per day 09/22/12 1753     09/22/12 1600  piperacillin-tazobactam (ZOSYN) IVPB 3.375 g       3.375 g 12.5 mL/hr over 240 Minutes Intravenous  Once 09/22/12 1559 09/22/12 2022           Assessment/Plan 21 y/o male s/p IR drainage of abdominal abscess after complicated appendectomy in July 1.  Leukocytosis-will cont to monitor, ordered CBC today 2.  ABL Anemia-mild/stable 3.  Ambulation OOB/IS 4.  Progress diet as tolerated 5.  May be ready to go  home tomorrow? 6.  Father asked about repeat CT scan, told him I would discuss with Dr. Johna Sheriff, but given his improvement he may not need one 7.  Started oral pain meds; oral abx in prep for home?    LOS: 2 days    DORT, Aundra Millet 09/24/2012, 10:59 AM Pager: [phone]

## 2012-09-25 LAB — CBC
HCT: 33 % — ABNORMAL LOW (ref 39.0–52.0)
MCH: 29.2 pg (ref 26.0–34.0)
MCV: 86.8 fL (ref 78.0–100.0)
Platelets: 241 10*3/uL (ref 150–400)
RBC: 3.8 MIL/uL — ABNORMAL LOW (ref 4.22–5.81)

## 2012-09-25 MED ORDER — BIOTENE DRY MOUTH MT LIQD
15.0000 mL | Freq: Two times a day (BID) | OROMUCOSAL | Status: DC
  Administered 2012-09-25 (×2): 15 mL via OROMUCOSAL

## 2012-09-25 NOTE — Progress Notes (Signed)
Patient interviewed and examined, agree with PA note above. Anticipate discharge tomorrow on oral abx Mariella Saa MD, FACS  09/25/2012 2:54 PM

## 2012-09-25 NOTE — Progress Notes (Signed)
Patient ID: Nathan Norman, male   DOB: [birth year], 21 y.o.   MRN: [id number]    Subjective: Pt feels much better today, less pain, +flatus, feels hungry, denies v/n    Objective: Vital signs in last 24 hours: Temp:  [98 F (36.7 C)-99 F (37.2 C)] 98.2 F (36.8 C) (12/20 0540) Pulse Rate:  [72-85] 85  (12/20 0540) Resp:  [18-20] 18  (12/20 0540) BP: (128-144)/(53-62) 131/57 mmHg (12/20 0540) SpO2:  [96 %-100 %] 100 % (12/20 0540) Last BM Date: 09/24/12  Intake/Output from previous day: 12/19 0701 - 12/20 0700 In: 1860 [P.O.:360; I.V.:1500] Out: 450 [Urine:450] Intake/Output this shift:    PE: General:  Awake, aleart, NAD Lungs:  RRR Lungs:  CTA Abd: Soft, NT/ND, +BS, incisions C/D/I   Lab Results:   Basename 09/25/12 0520 09/24/12 1339  WBC 9.5 11.6*  HGB 11.1* 12.5*  HCT 33.0* 36.5*  PLT 241 216   BMET  Basename 09/23/12 0610 09/22/12 1819 09/22/12 1238  NA 136 -- 137  K 4.0 -- 4.2  CL 100 -- 99  CO2 26 -- 26  GLUCOSE 111* -- 134*  BUN 14 -- 15  CREATININE 1.06 0.99 --  CALCIUM 8.7 -- 9.2   PT/INR  Basename 09/22/12 1819  LABPROT 16.2*  INR 1.33   CMP     Component Value Date/Time   NA 136 09/23/2012 0610   K 4.0 09/23/2012 0610   CL 100 09/23/2012 0610   CO2 26 09/23/2012 0610   GLUCOSE 111* 09/23/2012 0610   BUN 14 09/23/2012 0610   CREATININE 1.06 09/23/2012 0610   CALCIUM 8.7 09/23/2012 0610   PROT 6.6 09/23/2012 0610   ALBUMIN 3.1* 09/23/2012 0610   AST 19 09/23/2012 0610   ALT 16 09/23/2012 0610   ALKPHOS 61 09/23/2012 0610   BILITOT 4.2* 09/23/2012 0610   GFRNONAA >90 09/23/2012 0610   GFRAA >90 09/23/2012 0610   Lipase     Component Value Date/Time   LIPASE 25 09/22/2012 1238       Studies/Results: Ct Aspiration  09/23/2012  *RADIOLOGY REPORT*  Clinical history:21 year old with inflammatory process in the right abdominal cavity.  PROCEDURE(S): CT GUIDED ASPIRATION OF RIGHT ABDOMINAL ABSCESS  Physician: Rachelle Hora. Henn, MD   Medications:Versed 2 mg, Fentanyl 100 mcg.   A radiology nurse monitored the patient for moderate sedation.  Moderate sedation time:16 minutes  Fluoroscopy time: 7 seconds CT fluoroscopy  Procedure:The procedure was explained to the patient.  The risks and benefits of the procedure were discussed and the patient's questions were addressed.  Informed consent was obtained from the patient.  CT images of the abdomen were obtained.  The right upper abdomen was prepped and draped in a sterile fashion.  The skin was anesthetized with lidocaine.  18 gauge needle was directed into the right anterior abdominal fluid collection with CT guidance.  10 ml of brown purulent fluid was removed.  A wire could not be easily advanced into the collection and, therefore, a drain was not placed.  Findings:There is irregular collection in the anterior right upper abdomen, roughly measuring 3.2 cm.  Needle position was confirmed within the lesion.  Brown purulent fluid was removed.  Fluid sent for Gram stain and culture.  Complications: None  Impression:CT guided aspiration of the right anterior abdominal abscess.  10 ml of purulent fluid was removed.  Fluid sent for Gram stain and culture.   Original Report Authenticated By: Richarda Overlie, M.D.     Anti-infectives: Anti-infectives  Start     Dose/Rate Route Frequency Ordered Stop   09/22/12 2200   piperacillin-tazobactam (ZOSYN) IVPB 3.375 g        3.375 g 12.5 mL/hr over 240 Minutes Intravenous 3 times per day 09/22/12 1753     09/22/12 1600   piperacillin-tazobactam (ZOSYN) IVPB 3.375 g        3.375 g 12.5 mL/hr over 240 Minutes Intravenous  Once 09/22/12 1559 09/22/12 2022           Assessment/Plan 1. Intraabdominal abscess: 21 y/o male s/p IR drainage of abdominal abscess after complicated appendectomy in July.  Leukocytosis improving, tolerating diet  --recheck wbc tomorrow  --OOB and ambulate  --on zosyn  --diet as torated  --consider converting to PO abx  today and recheck tomorrow.    LOS: 3 days    WHITE, ELIZABETH 09/25/2012, 8:56 AM

## 2012-09-26 LAB — CBC
HCT: 35 % — ABNORMAL LOW (ref 39.0–52.0)
MCH: 28.8 pg (ref 26.0–34.0)
MCHC: 32.9 g/dL (ref 30.0–36.0)
RDW: 12.9 % (ref 11.5–15.5)

## 2012-09-26 MED ORDER — AMOXICILLIN-POT CLAVULANATE 875-125 MG PO TABS: 1.0000 | ORAL_TABLET | Freq: Two times a day (BID) | ORAL | Status: DC

## 2012-09-26 MED ORDER — TRAMADOL HCL 50 MG PO TABS: 50.0000 mg | ORAL_TABLET | Freq: Four times a day (QID) | ORAL | Status: DC | PRN

## 2012-09-26 MED ORDER — VALACYCLOVIR HCL 1 G PO TABS: 1000.0000 mg | ORAL_TABLET | Freq: Two times a day (BID) | ORAL | Status: DC

## 2012-09-26 MED ORDER — HYDROCODONE-ACETAMINOPHEN 5-325 MG PO TABS: 0.5000 | ORAL_TABLET | ORAL | Status: DC | PRN

## 2012-09-26 NOTE — Progress Notes (Signed)
Rash is noted to have gone down and is no longer reddened. Will continue to monitor.

## 2012-09-26 NOTE — Progress Notes (Signed)
Pt c/o rash to chest. Small amount of redness to bilateral upper chest. No SOB/problems breathening, or chest tightening. Previous RN, Okey Regal gave Benadryl IV. Will continue to monitor.

## 2012-09-26 NOTE — Progress Notes (Signed)
  Subjective: Feeling ok. Rash gone. Appetite ok but not back to normal. BM yesterday. No n/v.   Objective: Vital signs in last 24 hours: Temp:  [97.8 F (36.6 C)-99.9 F (37.7 C)] 99 F (37.2 C) (12/21 0537) Pulse Rate:  [66-80] 75  (12/21 0537) Resp:  [18] 18  (12/21 0537) BP: (129-136)/(58-64) 131/64 mmHg (12/21 0537) SpO2:  [98 %-99 %] 98 % (12/21 0537) Weight:  [214 lb 11.2 oz (97.387 kg)] 214 lb 11.2 oz (97.387 kg) (12/20 1500) Last BM Date: 09/25/12  Intake/Output from previous day: 12/20 0701 - 12/21 0700 In: 2466.7 [P.O.:1080; I.V.:1386.7] Out: -  Intake/Output this shift:    Alert, nad cta  Soft, nd, nt. +BS  Lab Results:   Basename 09/26/12 0500 09/25/12 0520  WBC 9.8 9.5  HGB 11.5* 11.1*  HCT 35.0* 33.0*  PLT 285 241   BMET No results found for this basename: NA:2,K:2,CL:2,CO2:2,GLUCOSE:2,BUN:2,CREATININE:2,CALCIUM:2 in the last 72 hours PT/INR No results found for this basename: LABPROT:2,INR:2 in the last 72 hours ABG No results found for this basename: PHART:2,PCO2:2,PO2:2,HCO3:2 in the last 72 hours  Studies/Results: No results found.  Anti-infectives: Anti-infectives     Start     Dose/Rate Route Frequency Ordered Stop   09/22/12 2200   piperacillin-tazobactam (ZOSYN) IVPB 3.375 g        3.375 g 12.5 mL/hr over 240 Minutes Intravenous 3 times per day 09/22/12 1753     09/22/12 1600   piperacillin-tazobactam (ZOSYN) IVPB 3.375 g        3.375 g 12.5 mL/hr over 240 Minutes Intravenous  Once 09/22/12 1559 09/22/12 2022          Assessment/Plan: Intra-abdominal abscess s/p CT guided aspiration.   cx negative to date Afebrile, nml wbc.  Has bowel function  Explained that it may take a while for appetite to fully return. Encouraged small light meals 4-5/day. Also consider boost/ensure.  D/w pt and father d/c instructions Will give rx for cold sore F/u dr Sophronia Simas. Andrey Campanile, MD, FACS General, Bariatric, & Minimally Invasive  Surgery [facility], [place]   LOS: 4 days    Nathan Norman 09/26/2012

## 2012-09-26 NOTE — Progress Notes (Signed)
Brooke Dare to be D/C'd Home per MD order.  Discussed with the patient and all questions fully answered.   Eziah, Negro  Home Medication Instructions ZOX:[id number]   Printed on:09/26/12 1401  Medication Information                    ibuprofen (ADVIL,MOTRIN) 200 MG tablet Take 400 mg by mouth every 6 (six) hours as needed. For fever           Ibuprofen-Diphenhydramine Cit (MOTRIN PM PO) Take 2 tablets by mouth once.           HYDROcodone-acetaminophen (NORCO/VICODIN) 5-325 MG per tablet Take 0.5-2 tablets by mouth every 4 (four) hours as needed (take 1/2 tab for mild pain, 1 tab for moderate pain, and 2 tabs for severe pain).           amoxicillin-clavulanate (AUGMENTIN) 875-125 MG per tablet Take 1 tablet by mouth 2 (two) times daily.           valACYclovir (VALTREX) 1000 MG tablet Take 1 tablet (1,000 mg total) by mouth 2 (two) times daily.           traMADol (ULTRAM) 50 MG tablet Take 1 tablet (50 mg total) by mouth every 6 (six) hours as needed for pain.             VVS, Skin clean, dry and intact without evidence of skin break down, no evidence of skin tears noted. IV catheter discontinued intact. Site without signs and symptoms of complications. Dressing and pressure applied.  An After Visit Summary was printed and given to the patient. Follow up appointments , new prescriptions and medication administration times given. Gave pt discharge instructions for care of incision. Patient escorted via WC, and D/C home via private auto.  Cindra Eves, RN 09/26/2012 2:01 PM

## 2012-09-27 LAB — CULTURE, ROUTINE-ABSCESS: Culture: NO GROWTH

## 2012-09-27 NOTE — ED Provider Notes (Signed)
Medical screening examination/treatment/procedure(s) were conducted as a shared visit with non-physician practitioner(s) and myself.  I personally evaluated the patient during the encounter   Shavon Zenz, MD 09/27/12 0006 

## 2012-09-29 LAB — CULTURE, BLOOD (ROUTINE X 2)

## 2012-10-01 ENCOUNTER — Emergency Department (HOSPITAL_COMMUNITY): Payer: 59

## 2012-10-01 ENCOUNTER — Telehealth (INDEPENDENT_AMBULATORY_CARE_PROVIDER_SITE_OTHER): Payer: Self-pay | Admitting: General Surgery

## 2012-10-01 ENCOUNTER — Emergency Department (HOSPITAL_COMMUNITY)
Admission: EM | Admit: 2012-10-01 | Discharge: 2012-10-01 | Disposition: A | Discharge status: Home / Self Care | Payer: 59 | Attending: Emergency Medicine | Admitting: Emergency Medicine

## 2012-10-01 DIAGNOSIS — J159 Unspecified bacterial pneumonia: Secondary | ICD-10-CM | POA: Insufficient documentation

## 2012-10-01 DIAGNOSIS — J189 Pneumonia, unspecified organism: Secondary | ICD-10-CM

## 2012-10-01 DIAGNOSIS — M549 Dorsalgia, unspecified: Secondary | ICD-10-CM | POA: Insufficient documentation

## 2012-10-01 DIAGNOSIS — R1031 Right lower quadrant pain: Secondary | ICD-10-CM | POA: Insufficient documentation

## 2012-10-01 DIAGNOSIS — L02219 Cutaneous abscess of trunk, unspecified: Secondary | ICD-10-CM | POA: Insufficient documentation

## 2012-10-01 DIAGNOSIS — L03319 Cellulitis of trunk, unspecified: Secondary | ICD-10-CM | POA: Insufficient documentation

## 2012-10-01 LAB — COMPREHENSIVE METABOLIC PANEL
ALT: 49 U/L (ref 0–53)
AST: 26 U/L (ref 0–37)
Albumin: 3.6 g/dL (ref 3.5–5.2)
Alkaline Phosphatase: 88 U/L (ref 39–117)
Calcium: 10 mg/dL (ref 8.4–10.5)
GFR calc Af Amer: >90 mL/min (ref >90)
Potassium: 4.2 mEq/L (ref 3.5–5.1)
Sodium: 137 mEq/L (ref 135–145)
Total Protein: 8.1 g/dL (ref 6.0–8.3)

## 2012-10-01 LAB — CBC WITH DIFFERENTIAL/PLATELET
Basophils Absolute: 0 10*3/uL (ref 0.0–0.1)
Basophils Relative: 0 % (ref 0–1)
Eosinophils Absolute: 0.1 10*3/uL (ref 0.0–0.7)
Eosinophils Relative: 1 % (ref 0–5)
Lymphs Abs: 2.2 10*3/uL (ref 0.7–4.0)
MCH: 29.5 pg (ref 26.0–34.0)
MCHC: 34.2 g/dL (ref 30.0–36.0)
MCV: 86.2 fL (ref 78.0–100.0)
Neutrophils Relative %: 69 % (ref 43–77)
Platelets: 462 10*3/uL — ABNORMAL HIGH (ref 150–400)
RBC: 4.78 MIL/uL (ref 4.22–5.81)
RDW: 12.7 % (ref 11.5–15.5)

## 2012-10-01 MED ORDER — AMOXICILLIN-POT CLAVULANATE 875-125 MG PO TABS: 1.0000 | ORAL_TABLET | Freq: Two times a day (BID) | ORAL | Status: DC

## 2012-10-01 MED ORDER — TRAMADOL HCL 50 MG PO TABS: 50.0000 mg | ORAL_TABLET | Freq: Four times a day (QID) | ORAL | Status: DC | PRN

## 2012-10-01 MED ORDER — AZITHROMYCIN 250 MG PO TABS: 250.0000 mg | ORAL_TABLET | Freq: Every day | ORAL | Status: DC

## 2012-10-01 MED ORDER — OXYCODONE-ACETAMINOPHEN 5-325 MG PO TABS
1.0000 | ORAL_TABLET | Freq: Once | ORAL | Status: AC
  Administered 2012-10-01: 1 via ORAL
  Filled 2012-10-01: qty 1

## 2012-10-01 NOTE — ED Provider Notes (Addendum)
History  This chart was scribed for Nathan Racer, MD by Nathan Norman, ED Scribe. This patient was seen in room TR08C/TR08C and the patient's care was started at 1:03 PM.  CSN: [id number]  Arrival date & time 10/01/12  1255   First MD Initiated Contact with Patient 10/01/12 1303      Chief Complaint  Patient presents with  . Back Pain  . Chest Pain    Patient is a 21 y.o. male presenting with chest pain. The history is provided by the patient. No language interpreter was used.  Chest Pain The chest pain began 3 - 5 days ago. Chest pain occurs constantly. The chest pain is worsening. The quality of the pain is described as pressure-like. The pain does not radiate. Chest pain is worsened by deep breathing. Pertinent negatives for primary symptoms include no fever, no abdominal pain, no nausea and no vomiting.  Pertinent negatives for past medical history include no diabetes, no hyperlipidemia, no hypertension and no MI.    ,Nathan Norman is a 21 y.o. male who presents to the Emergency Department complaining of 4 days of gradual onset, gradually worsening, constant, non-radiating bilateral lower chest pain that started after he was admitted and discharged for an intra abdominal abscess. He was seen 9 days ago in the ED for abdominal pain and was admitted for an intra abdominal abscess that was thought to be secondary to a meckel's diverticulum. He had the abscess drained through IR and was discharged 5 days ago. He denies having an official meckel's diverticulum diagnosis. He reports that at first that pain was in the RLQ but then started to move to the bilateral lower chest 1 to 2 days after discharge. He states that the pain is worse with deep inspiration and coughing. He reports that he is still on Augmentin and Toradol which improve his symptoms. He denies fevers and chills as associated symptoms. He does not have a h/o chronic medical conditions and denies smoking and alcohol use.   No  past medical history on file.  Past Surgical History  Procedure Date  . Laparoscopic appendectomy July 2013    Apparently ruptured coming out.    No family history on file.  History  Substance Use Topics  . Smoking status: Never Smoker   . Smokeless tobacco: Not on file  . Alcohol Use: No      Review of Systems  Constitutional: Negative for fever and chills.  Cardiovascular: Positive for chest pain (bilateral lower CP).  Gastrointestinal: Negative for nausea, vomiting, abdominal pain and diarrhea.  All other systems reviewed and are negative.    Allergies  Review of patient's allergies indicates no known allergies.  Home Medications   Current Outpatient Rx  Name  Route  Sig  Dispense  Refill  . IBUPROFEN 200 MG PO TABS   Oral   Take 600 mg by mouth every 6 (six) hours as needed. For fever         . TRAMADOL HCL 50 MG PO TABS   Oral   Take 50 mg by mouth every 6 (six) hours as needed. For pain         . AMOXICILLIN-POT CLAVULANATE 875-125 MG PO TABS   Oral   Take 1 tablet by mouth 2 (two) times daily. For 7 days   14 tablet   0   . AZITHROMYCIN 250 MG PO TABS   Oral   Take 1 tablet (250 mg total) by mouth daily. Take first 2 tablets  together, then 1 every day until finished.   6 tablet   0   . HYDROCODONE-ACETAMINOPHEN 5-325 MG PO TABS   Oral   Take 0.5-2 tablets by mouth every 4 (four) hours as needed (take 1/2 tab for mild pain, 1 tab for moderate pain, and 2 tabs for severe pain).   20 tablet   0   . TRAMADOL HCL 50 MG PO TABS   Oral   Take 1 tablet (50 mg total) by mouth every 6 (six) hours as needed for pain.   20 tablet   0     Triage Vitals: BP 136/75  Pulse 66  Temp 97.7 F (36.5 C) (Oral)  Resp 12  SpO2 100%  Physical Exam  Nursing note and vitals reviewed. Constitutional: He is oriented to person, place, and time. He appears well-developed and well-nourished. No distress.  HENT:  Head: Normocephalic and atraumatic.  Eyes:  Conjunctivae normal and EOM are normal.  Neck: Neck supple. No tracheal deviation present.  Cardiovascular: Normal rate, regular rhythm and intact distal pulses.   Pulmonary/Chest: Effort normal and breath sounds normal. No respiratory distress. He has no wheezes. He has no rales. He exhibits no tenderness (no tenderness to palpation).  Abdominal: Soft. Bowel sounds are normal. He exhibits no distension. There is no tenderness (no tenderness to palpation). There is no rebound and no guarding.       Well healing surgical scar in the RLQ, no signs of injection.  Musculoskeletal: Normal range of motion. He exhibits no edema.  Neurological: He is alert and oriented to person, place, and time. He has normal strength. No sensory deficit. He displays no seizure activity.  Skin: Skin is warm and dry.  Psychiatric: He has a normal mood and affect. His behavior is normal.    ED Course  Procedures (including critical care time)  DIAGNOSTIC STUDIES: Oxygen Saturation is 100% on room air, normal by my interpretation.    COORDINATION OF CARE: 1:10 PM- Discussed treatment plan which includes CXR, blood work and pain medication with pt at bedside and pt agreed to plan.  1:15 PM- Ordered one 5-325 mg percocet tablet  3:25 PM- Advised pt of radiology and lab work results. Discussed discharge plan which includes reassessment in 2 days with pt and pt agreed to plan.   Labs Reviewed  CBC WITH DIFFERENTIAL - Abnormal; Notable for the following:    Platelets 462 (*)     All other components within normal limits  COMPREHENSIVE METABOLIC PANEL   Dg Chest 2 View  10/01/2012  *RADIOLOGY REPORT*  Clinical Data: Bilateral lower chest pain.  CHEST - 2 VIEW  Comparison: CT abdomen pelvis 09/22/2012.  Findings: The heart size is normal.  Ill-defined lower lobe airspace disease is best appreciated on the lateral view.  Subtle changes on the PA view are more prominent on the right.  No other significant airspace  consolidation is evident.  Rightward curvature of the thoracic spine is noted. The visualized soft tissues and bony thorax are unremarkable.  IMPRESSION: 1.  Lower lobe pneumonia, most likely on the right.  This could be bilateral.   Original Report Authenticated By: Marin Roberts, M.D.      1. Healthcare-associated pneumonia       MDM  I personally performed the services described in this documentation, which was scribed in my presence. The recorded information has been reviewed and is accurate.     Pt gradual onset bl lower chest pain worse with deep inspiration.  No fever chills, cough. No lower ext swelling or pain. Pt has been on augmentin since d/c from hosp. Normal WBC. CXR with possible RLL/LLL pneumonia. Long discussion with pt and his father. Discussed that this may represent partially-treated/ subclinical presentation for pneumonia given recent and current abx. Will d/c home with continuation of augmentin x 1 week and z-pak to cover atypicals. Think PE very unlikely given indolent nature of pain, normal HR and O2 sats, no evidence of DVT. I am hesitant to CT pt's chest given 2 recent abd CT's and low probability of dx of PE. Pt's symptoms may also be due to atelectaisis. He is advised to continue with incentive spirometer. I have advised pt to return in 2 days for re-evaluation. If having worsening infectious symptoms may need to be admitted for IV abx. If having persistent CP without infectious symptoms may need CT chest. Both pt and father of pt understand the decision making and are in agreement. I have advised that if there is any worsening of symptoms pt needs to return immediately.   Pt is currently very well appearing, in no resp distress with normal VS.   Nathan Racer, MD 10/01/12 1542  Nathan Racer, MD 10/01/12 [phone]

## 2012-10-01 NOTE — Telephone Encounter (Signed)
Mother called to report pt complaining of not being able to take a deep breath.  Stated he has to take several smaller breaths instead.  Advised to return to the ER [facility], perferred; recent DOW pt there) for a work-up.  Paged and updated NP as well.

## 2012-10-01 NOTE — ED Notes (Signed)
Bilateral back pain - more stationary on upper sides of hip. Started last week after being d/c'd for abscess drainage.

## 2012-10-01 NOTE — ED Notes (Signed)
MD at bedside. 

## 2012-10-03 ENCOUNTER — Encounter (HOSPITAL_COMMUNITY): Payer: Self-pay | Admitting: *Deleted

## 2012-10-03 ENCOUNTER — Emergency Department (HOSPITAL_COMMUNITY)
Admission: EM | Admit: 2012-10-03 | Discharge: 2012-10-03 | Disposition: A | Discharge status: Home / Self Care | Payer: 59 | Attending: Emergency Medicine | Admitting: Emergency Medicine

## 2012-10-03 ENCOUNTER — Emergency Department (HOSPITAL_COMMUNITY): Payer: 59

## 2012-10-03 DIAGNOSIS — M2550 Pain in unspecified joint: Secondary | ICD-10-CM | POA: Insufficient documentation

## 2012-10-03 DIAGNOSIS — Z79899 Other long term (current) drug therapy: Secondary | ICD-10-CM | POA: Insufficient documentation

## 2012-10-03 DIAGNOSIS — J189 Pneumonia, unspecified organism: Secondary | ICD-10-CM | POA: Insufficient documentation

## 2012-10-03 DIAGNOSIS — R079 Chest pain, unspecified: Secondary | ICD-10-CM | POA: Insufficient documentation

## 2012-10-03 MED ORDER — IOHEXOL 350 MG/ML SOLN
80.0000 mL | Freq: Once | INTRAVENOUS | Status: AC | PRN
  Administered 2012-10-03: 80 mL via INTRAVENOUS

## 2012-10-03 MED ORDER — SODIUM CHLORIDE 0.9 % IV BOLUS (SEPSIS)
500.0000 mL | Freq: Once | INTRAVENOUS | Status: AC
  Administered 2012-10-03: 500 mL via INTRAVENOUS

## 2012-10-03 NOTE — ED Notes (Signed)
Pt returned from CT °

## 2012-10-03 NOTE — ED Notes (Signed)
Spoke with CT and informed pt has IV and creatinine from two days ago per Dr. Rubin Payor.

## 2012-10-03 NOTE — ED Provider Notes (Addendum)
History   This chart was scribed for [employer]. Rubin Payor, MD by Charolett Bumpers, ED Scribe. The patient was seen in room TR09C/TR09C. Patient's care was started at 1221.   CSN: [id number]  Arrival date & time 10/03/12  1141   First MD Initiated Contact with Patient 10/03/12 1221      Chief Complaint  Patient presents with  . Follow-up    The history is provided by the patient. No language interpreter was used.  Nathan Norman is a 21 y.o. male who presents to the Emergency Department for a follow-up evaluation. He states that he was seen here to have an abscess drained on his right lower abdomen. He was not d/c with a spirometer. He later returned with upper rib pain and dx with pneumonia. He was then d/c home with antibiotics and a spirometer. He states that he was told to f/u today for a re-evaluation. He states he still has pain with breathing but denies any SOB. He states he has improved some and denies any worsening of his symptoms. He denies any current fevers.   History reviewed. No pertinent past medical history.  Past Surgical History  Procedure Date  . Laparoscopic appendectomy July 2013    Apparently ruptured coming out.    History reviewed. No pertinent family history.  History  Substance Use Topics  . Smoking status: Never Smoker   . Smokeless tobacco: Not on file  . Alcohol Use: No      Review of Systems  Constitutional: Negative for fever.  Respiratory: Negative for shortness of breath.   Musculoskeletal: Positive for arthralgias.       Rib pain  All other systems reviewed and are negative.    Allergies  Review of patient's allergies indicates no known allergies.  Home Medications   Current Outpatient Rx  Name  Route  Sig  Dispense  Refill  . AMOXICILLIN-POT CLAVULANATE 875-125 MG PO TABS   Oral   Take 1 tablet by mouth 2 (two) times daily. For 7 days   14 tablet   0   . AZITHROMYCIN 250 MG PO TABS   Oral   Take 250 mg by mouth daily.  Take first 2 tablets together, then 1 every day until finished.         Marland Kitchen BACITRACIN ZINC 500 UNIT/GM EX OINT   Topical   Apply 1 application topically daily. Apply to lip         . TRAMADOL HCL 50 MG PO TABS   Oral   Take 50 mg by mouth every 6 (six) hours as needed. For pain           BP 126/73  Pulse 66  Temp 97.5 F (36.4 C) (Oral)  Resp 18  SpO2 100%  Physical Exam  Nursing note and vitals reviewed. Constitutional: He is oriented to person, place, and time. He appears well-developed and well-nourished. No distress.       Well appearing.   HENT:  Head: Normocephalic and atraumatic.       Scab to upper lip.   Eyes: EOM are normal.  Neck: Neck supple. No tracheal deviation present.  Cardiovascular: Normal rate, regular rhythm and normal heart sounds.   Pulmonary/Chest: Effort normal and breath sounds normal. No respiratory distress. He has no wheezes. He exhibits no tenderness.       Lungs are clear.   Abdominal: Soft. There is tenderness.       Mild RLQ abdominal tenderness.   Musculoskeletal: Normal  range of motion.  Neurological: He is alert and oriented to person, place, and time.  Skin: Skin is warm and dry.  Psychiatric: He has a normal mood and affect. His behavior is normal.    ED Course  Procedures (including critical care time)   COORDINATION OF CARE:  12:25-Discussed planned course of treatment with the patient including reviewing old records, who is agreeable at this time.     Labs Reviewed  D-DIMER, QUANTITATIVE   Dg Chest 2 View  10/01/2012  *RADIOLOGY REPORT*  Clinical Data: Bilateral lower chest pain.  CHEST - 2 VIEW  Comparison: CT abdomen pelvis 09/22/2012.  Findings: The heart size is normal.  Ill-defined lower lobe airspace disease is best appreciated on the lateral view.  Subtle changes on the PA view are more prominent on the right.  No other significant airspace consolidation is evident.  Rightward curvature of the thoracic spine is  noted. The visualized soft tissues and bony thorax are unremarkable.  IMPRESSION: 1.  Lower lobe pneumonia, most likely on the right.  This could be bilateral.   Original Report Authenticated By: Marin Roberts, M.D.      No diagnosis found.    MDM  Patient presents for a followup visit 2 days after previous visit. He had a previous intra-abdominal abscess after that developed some chest pain. Using the R2 days ago and was diagnosed with possible HCAP. However the diagnosis of pulmonary embolism was considered. He had had recent surgery. He has not had hemoptysis or productive cough. The planned by the previous physician was worsening infectious syndromes would get IV antibiotics. Continue chest pain without infectious syndromes prompt more workup for pulmonary embolus. At this time I think he is low enough risk where I could get a d-dimer. If the d-dimer is positive he will get a pulmonary embolism CT scan.    I personally performed the services described in this documentation, which was scribed in my presence. The recorded information has been reviewed and is accurate.       Juliet Rude. Rubin Payor, MD 10/03/12 1234  Patient was negative CT angio after positive d-dimer. No pneumonia. Patient will be discharged home. We'll stop the Avelox.  Juliet Rude. Rubin Payor, MD 10/03/12 [phone]

## 2012-10-03 NOTE — ED Notes (Signed)
Pt reports being seen on Thursday for rib pain and was diagnosed with pneumonia, dc home and given antibiotics. Pt reports feeling better, no distress noted at triage. Was told to come in today for recheck.

## 2012-10-03 NOTE — ED Notes (Signed)
Pt ambulatory leaving ED with father; pt given d/c teaching. Pt verbalized understand of d/c teaching and has no further questions upon d/c. Pt does not appear to be in acute distress.

## 2012-10-08 ENCOUNTER — Encounter (INDEPENDENT_AMBULATORY_CARE_PROVIDER_SITE_OTHER): Payer: Self-pay | Admitting: General Surgery

## 2012-10-08 ENCOUNTER — Ambulatory Visit (INDEPENDENT_AMBULATORY_CARE_PROVIDER_SITE_OTHER): Payer: 59 | Admitting: General Surgery

## 2012-10-08 VITALS — BP 138/88 | HR 60 | Temp 97.8°F | Resp 16 | Ht 74.0 in | Wt 203.0 lb

## 2012-10-08 DIAGNOSIS — IMO0002 Reserved for concepts with insufficient information to code with codable children: Secondary | ICD-10-CM

## 2012-10-08 DIAGNOSIS — K651 Peritoneal abscess: Secondary | ICD-10-CM

## 2012-10-08 NOTE — Patient Instructions (Addendum)
No activity restrictions. Call for any recurrent symptoms.

## 2012-10-08 NOTE — Progress Notes (Signed)
Chief complaint: Followup abdominal abscess  History: Patient returned to the office for followup. He has a history of laparoscopic appendectomy at [facility] in July. He had perforated appendicitis which required the appendix to be removed piecemeal. He was readmitted to our facility December 17 with a right lower quadrant abscess and phlegmon. This was aspirated and treated with IV antibiotics with steady resolution. He was discharged on 2 weeks of oral and the buttocks which is just completed. At this point he denies any significant abdominal pain. He does feel a slight twinge of discomfort occasionally with deep breath. No activity limitations. No GI complaints or fever.  Exam: BP 138/88  Pulse 60  Temp 97.8 F (36.6 C) (Temporal)  Resp 16  Ht 6\' 2"  (1.88 m)  Wt 203 lb (92.08 kg)  BMI 26.06 kg/m2 General: Appears well Abdomen: Soft without any tenderness to deep palpation. No mass or guarding.  Assessment and plan: Status post abdominal abscess presumably secondary to his perforated appendicitis in July. He is clinically resolved. I told him to contact us should he have any recurrent symptoms. He is released with no restrictions.

## 2012-10-16 ENCOUNTER — Telehealth (INDEPENDENT_AMBULATORY_CARE_PROVIDER_SITE_OTHER): Payer: Self-pay

## 2012-10-16 NOTE — Telephone Encounter (Signed)
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 Pt advised he needs to contact his PCP for this refill. Pt states he understands and will call Dr Sullivan Lone.

## 2012-10-16 NOTE — Discharge Summary (Signed)
  Physician Discharge Summary  Patient ID: Nathan Norman MRN: [id number] DOB/AGE: 29-Mar-1991 21 y.o.  Admit date: 09/22/2012 Discharge date: 09/26/2012  Admitting Diagnosis: Abdominal pain Nausea Vomiting  Discharge Diagnosis Intraabdominal abscess  Consultants Interventional radiology  Procedures IR drain placement  Hospital Course:  22 yr old male with abominal pain, nausea and vomiting.  Evaluation showed an intraabdominal abscess.  The patient was admitted and started on IV abx.  IR was consulted and they were able to aspirate some of the fluid.  Over the next several days the patient's diet was advanced as he was able to tolerate.  On 12/21, the patient's pain was controlled, tolerating a diet, and was afebrile.  He was felt stable for discharge home     Medication List     As of 10/16/2012  9:00 AM           Follow-up Information    Follow up with HOXWORTH,BENJAMIN T, MD. Schedule an appointment as soon as possible for a visit in 2 weeks.   Contact information:   [address] [place] [address] [phone]          Signed: Denny Levy [facility] [phone]  10/16/2012, 9:00 AM

## 2012-11-21 ENCOUNTER — Other Ambulatory Visit: Payer: Self-pay

## 2013-08-12 ENCOUNTER — Other Ambulatory Visit: Payer: Self-pay

## 2014-09-12 ENCOUNTER — Other Ambulatory Visit: Payer: Self-pay | Admitting: Occupational Medicine

## 2014-09-12 ENCOUNTER — Ambulatory Visit
Admission: RE | Admit: 2014-09-12 | Discharge: 2014-09-12 | Disposition: A | Discharge status: Home / Self Care | Payer: No Typology Code available for payment source | Source: Ambulatory Visit | Attending: Occupational Medicine | Admitting: Occupational Medicine

## 2014-09-12 DIAGNOSIS — Z021 Encounter for pre-employment examination: Secondary | ICD-10-CM

## 2015-01-29 NOTE — H&P (Signed)
Subjective/Chief Complaint 24 hr abdominal pain    History of Present Illness 20 yowm with about 24 hours abdominal pain located in rlq and mild nausea.  no emesis, no sick contacts.  No other health issues. no previous operations.    Past History none   Past Med/Surgical Hx:  Denies medical history:   ALLERGIES:  No Known Allergies:     Medications none.   Family and Social History:   Family History Non-Contributory    Social History negative tobacco, negative ETOH, negative Illicit drugs    Place of Living Home   Review of Systems:   Subjective/Chief Complaint see above    Abdominal Pain Yes    Nausea/Vomiting No    Dysuria No    Medications/Allergies Reviewed Medications/Allergies reviewed   Physical Exam:   GEN no acute distress    HEENT pale conjunctivae    NECK supple    ABD positive tenderness    EXTR negative cyanosis/clubbing    SKIN normal to palpation    NEURO cranial nerves intact    PSYCH A+O to time, place, person   Radiology Results: CT:    04-Jul-13 13:04, CT Abdomen and Pelvis With Contrast   CT Abdomen and Pelvis With Contrast   REASON FOR EXAM:    rlq pain  COMMENTS:       PROCEDURE: CT  - CT ABDOMEN / PELVIS  W  - Apr 09 2012  1:04PM     RESULT: Axial CT scanning was performed through the abdomen and pelvis   with reconstructions at 3 mm intervals and slice thicknesses. The patient   received 100 cc of Isovue-370 as well as oral contrast serial.    There is an inflamed edematous appendix containing an appendicolith in   the right lower quadrant of the abdomen. The maximal measured diameter of   the appendix is 2.6 cm. There is inflammatory change in the surrounding   fat but there is no discrete abscess nor free air. There is no evidence   of a small or large bowel obstruction. There is free fluid in the pelvis.    The liver, gallbladder, pancreas, spleen, stomach,adrenal glands,   kidneys, and urinary bladder are normal  in appearance. There is a 1.5 cm   diameter hypodensity in the lower pole of the left kidney with Hounsfield   measurement of +3 most compatible with a cyst. There is no inguinal nor   umbilical hernia. The lung bases are clear. The lumbar vertebral bodies   are preserved in height.    IMPRESSION:   1. The findings are consistent with acute appendicitis. There is free   fluid in the pelvis and there are inflammatory changes in the   periappendiceal region but no discrete abscess or evidence of perforation   is seen. A few mildly enlarged mesenteric lymph nodes are demonstrated in   the right lower quadrant of the abdomen.  2. There are no acute abnormalities elsewhere within the abdomen or   pelvis.   This report [facility] at 1:15 p.m. on April 09, 2012, and the message left on the voice mail. This contact will be   followed up shortly and the patient will be ultimately [facility] to [facility]   emergency department for surgical consultation and admission, unless we   are directed otherwise by the caregivers at [facility].          Verified  By: DAVID A. [place], M.D., MD     Assessment/Admission Diagnosis 20 yowm with acute appendicitis    Plan admit, NPO, IVF IV invanz, laparoscopic appendectomy.Marland Kitchen discussed in detail with patient and family.   Electronic Signatures: Natale Lay (MD)  (Signed 04-Jul-13 16:09)  Authored: CHIEF COMPLAINT and HISTORY, PAST MEDICAL/SURGIAL HISTORY, ALLERGIES, OTHER MEDICATIONS, FAMILY AND SOCIAL HISTORY, REVIEW OF SYSTEMS, PHYSICAL EXAM, Radiology, ASSESSMENT AND PLAN   Last Updated: 04-Jul-13 16:09 by Natale Lay (MD)

## 2015-01-29 NOTE — Op Note (Signed)
PATIENT NAME:  Nathan Norman, Nathan Norman MR#:  [id number] DATE OF BIRTH:  1991/09/12  DATE OF PROCEDURE:  04/09/2012  PREOPERATIVE DIAGNOSIS: Acute appendicitis.   POSTOPERATIVE DIAGNOSIS: Acute appendicitis.      PROCEDURE PERFORMED: Laparoscopic appendectomy.   SURGEON: Jceon Alverio A. Egbert GaribaldiBird, M.D.   ASSISTANT: None.   ANESTHESIA: General endotracheal.   FINDINGS: Acute appendicitis. SPECIMENS: Appendix to pathology.  ESTIMATED BLOOD LOSS: Minimal.   DESCRIPTION OF PROCEDURE: With informed consent obtained from the patient, brought to the Operating Room and positioned supine. General oral endotracheal anesthesia was induced. The left arm was padded and tucked at his side. The patient's abdomen was widely clipped of hair, prepped and draped with ChloraPrep solution.   Time-out was observed.   A 12 mm blunt Hassan trocar was placed in an open technique through an infraumbilical transversely oriented skin incision with stay sutures being passed through the fascia. Pneumoperitoneum was established. A 5 mm Bladeless trocar was placed in the right upper quadrant and a 5 mm Bladeless trocar was placed in the left lower quadrant. The appendix was readily identifiable in the right lower quadrant, covering with a tongue of omentum in an adhesive fashion. The patient was then positioned right side up and in Trendelenburg position. Cloudy fluid was aspirated from the pelvis. The appendix was peeled away from the omentum. The appendix was elevated towards the anterior abdominal wall. The base of the appendix was identified. A window was fashioned between the mesoappendix and the base of the appendix with blunt technique. Initially, I used a harmonic scalpel to attempt to divide the mesentery, however, this area was very indurated and inflamed and the instrument was not obtaining proper coagulation. As such, the 5 mm Bladeless trocar in the right upper quadrant was exchanged for a 12 mm Bladeless trocar under direct  visualization. This allowed passage of a blue load of the endoscopic 75 mm stapler allowing transection of the base of the appendix. At this point, the mesoappendix was easily taken with a single fire of the white load of the stapler. Hemostasis on the operative field appeared to be adequate. The appendix was placed into an EndoCatch device and retrieved. In order to accomplish this, the infraumbilical fascial incision needed to be extended approximately 1.5 cm inferiorly with Mayo scissors. Pneumoperitoneum being re-established, the right lower quadrant was copiously irrigated with warm normal saline and aspirated dry. Hemostasis was adequate on the operative field. All fluid was aspirated.   The right upper quadrant 12 mm trocar site was closed with Perclose device x2 with 2-0 Vicryl sutures in simple fashion. Remaining trocars were removed under direct visualization and the infraumbilical fascial defect was closed with multiple vertically oriented simple sutures of #0 Vicryl. A total of 30 mL of 0.25% plain Marcaine was infiltrated along all skin and fascial incisions prior to closure. 4-0 Vicryl subcuticular was applied to all skin edges followed by the application of benzoin, Steri-Strips, Telfa, and Tegaderm. The patient was then subsequently extubated and taken to the recovery room in stable and satisfactory condition by anesthesia services.   ____________________________ Redge GainerMark A. Egbert GaribaldiBird, MD mab:ap D: 04/09/2012 22:09:27 ET T: 04/10/2012 10:50:14 ET JOB#: [id number]  cc: Loraine LericheMark A. Egbert GaribaldiBird, MD, <Dictator> Richard L. Sullivan LoneGilbert, MD Sharone Almond Kela MillinA Allysa Governale MD ELECTRONICALLY SIGNED 04/10/2012 20:00

## 2015-01-29 NOTE — Discharge Summary (Signed)
PATIENT NAME:  Nathan Norman, Nathan Norman MR#:  [id number] DATE OF BIRTH:  21-Jun-1991  DATE OF ADMISSION:  04/09/2012 DATE OF DISCHARGE:  04/12/2012  BRIEF HISTORY: [name] is a 24 year old gentleman admitted through the Emergency Room in the early morning of 04/09/2012. He was noted to have changes consistent with possible appendicitis and underwent surgery early the next morning. He did have suppurative appendicitis. He had very slow return of bowel function. On the evening of the 6th, he had a low-grade fever and shaking chills. We continued his antibiotic therapy. This morning he's been afebrile. White blood cell count is back to normal and he is tolerating a regular diet. His wounds look good. There is no sign of any infection. We will discharge him home today to be followed in the office in 7 to 10 days' time. Bathing, activity, and driving instructions were given to the patient. He is to take Norco 5/325 p.o. q.6 hours p.r.n. pain.   FINAL DISCHARGE DIAGNOSIS: Acute appendicitis.   SURGERY: Laparoscopic appendectomy.  ____________________________ Carmie Endalph L. Ely III, MD rle:drc D: 04/12/2012 17:22:39 ET T: 04/14/2012 10:09:13 ET JOB#: [id number]  cc: Quentin Orealph L. Ely III, MD, <Dictator> Richard L. Sullivan LoneGilbert, MD Quentin OreALPH L ELY MD ELECTRONICALLY SIGNED 04/14/2012 16:01

## 2019-09-06 ENCOUNTER — Telehealth: Payer: Self-pay | Admitting: Family Medicine

## 2019-09-06 NOTE — Telephone Encounter (Signed)
Patient was scheduled for telephone visit today.

## 2019-09-06 NOTE — Telephone Encounter (Signed)
From PEC 

## 2019-09-06 NOTE — Telephone Encounter (Signed)
Ok thx.

## 2019-09-06 NOTE — Telephone Encounter (Signed)
Pt not seen in over 8 yrs and wants to know if [facility] wlll accept him back as a pt?

## 2019-09-06 NOTE — Telephone Encounter (Signed)
Please advise 

## 2019-09-10 NOTE — Progress Notes (Signed)
Patient: Nathan Norman, Male    DOB: November 02, 1990, 28 y.o.   MRN: [id number] Visit Date: 09/16/2019  Today's Provider: Wilhemena Durie, MD   Chief Complaint  Patient presents with  . Annual Exam   Subjective:     Annual physical exam Nathan Norman is a 28 y.o. male who presents today for health maintenance and complete physical. He feels well. He reports exercising yes. He reports he is sleeping well. 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  He has a 61-month-old son.Mallie Mussel.  He is feeling well and exercise regularly.  No complaints. He is worried as he and his brother have been taking oral supplements for their workouts and his brother has been told that they can cause liver damage.  He stopped the supplements a few weeks ago. -----------------------------------------------------------   Review of Systems  HENT: Negative.   Eyes: Negative.   Respiratory: Negative.   Cardiovascular: Negative.   Gastrointestinal: Negative.   Genitourinary: Negative.   Musculoskeletal: Negative.   Allergic/Immunologic: Positive for environmental allergies.  Neurological: Negative.   Hematological: Negative.   Psychiatric/Behavioral: Negative.     Social History      He  reports that he has never smoked. He has never used smokeless tobacco. He reports current alcohol use. He reports that he does not use drugs.       Social History   Socioeconomic History  . Marital status: Single    Spouse name: Not on file  . Number of children: Not on file  . Years of education: Not on file  . Highest education level: Not on file  Occupational History  . Not on file  Tobacco Use  . Smoking status: Never Smoker  . Smokeless tobacco: Never Used  Substance and Sexual Activity  . Alcohol use: Yes  . Drug use: No  . Sexual activity: Not on file  Other Topics Concern  . Not on file  Social History Narrative  . Not on file   Social Determinants of Health   Financial  Resource Strain:   . Difficulty of Paying Living Expenses: Not on file  Food Insecurity:   . Worried About Charity fundraiser in the Last Year: Not on file  . Ran Out of Food in the Last Year: Not on file  Transportation Needs:   . Lack of Transportation (Medical): Not on file  . Lack of Transportation (Non-Medical): Not on file  Physical Activity:   . Days of Exercise per Week: Not on file  . Minutes of Exercise per Session: Not on file  Stress:   . Feeling of Stress : Not on file  Social Connections:   . Frequency of Communication with Friends and Family: Not on file  . Frequency of Social Gatherings with Friends and Family: Not on file  . Attends Religious Services: Not on file  . Active Member of Clubs or Organizations: Not on file  . Attends Archivist Meetings: Not on file  . Marital Status: Not on file    No past medical history on file.   There are no problems to display for this patient.   Past Surgical History:  Procedure Laterality Date  . LAPAROSCOPIC APPENDECTOMY  July 2013   Apparently ruptured coming out.    Family History        Family Status  Relation Name Status  . Mother  Alive  . Father  Alive  His family history is not on file.      No Known Allergies  No current outpatient medications on file.   Patient Care Team: Maple Hudson., MD as PCP - General (Unknown Physician Specialty)    Objective:    Vitals: BP 127/73 (BP Location: Right Arm, Patient Position: Sitting, Cuff Size: Large)   Pulse 70   Temp (!) 97.1 F (36.2 C) (Other (Comment))   Resp 16   Ht 6\' 1"  (1.854 m)   Wt 218 lb (98.9 kg)   SpO2 98%   BMI 28.76 kg/m    Vitals:   09/13/19 1603  BP: 127/73  Pulse: 70  Resp: 16  Temp: (!) 97.1 F (36.2 C)  TempSrc: Other (Comment)  SpO2: 98%  Weight: 218 lb (98.9 kg)  Height: 6\' 1"  (1.854 m)     Physical Exam Vitals signs reviewed.  Constitutional:      Appearance: Normal appearance.  HENT:      Head: Normocephalic and atraumatic.     Right Ear: External ear normal.     Left Ear: Tympanic membrane and external ear normal.  Eyes:     General: No scleral icterus.    Conjunctiva/sclera: Conjunctivae normal.  Cardiovascular:     Rate and Rhythm: Normal rate and regular rhythm.     Pulses: Normal pulses.     Heart sounds: Normal heart sounds.  Pulmonary:     Effort: Pulmonary effort is normal.     Breath sounds: Normal breath sounds.  Abdominal:     Palpations: Abdomen is soft.  Genitourinary:    Penis: Normal.      Scrotum/Testes: Normal.  Lymphadenopathy:     Cervical: No cervical adenopathy.  Skin:    General: Skin is warm and dry.  Neurological:     General: No focal deficit present.     Mental Status: He is alert and oriented to person, place, and time.  Psychiatric:        Mood and Affect: Mood normal.        Behavior: Behavior normal.        Thought Content: Thought content normal.        Judgment: Judgment normal.      Depression Screen PHQ 2/9 Scores 09/13/2019  PHQ - 2 Score 0  PHQ- 9 Score 0       Assessment & Plan:     Routine Health Maintenance and Physical Exam  Exercise Activities and Dietary recommendations Goals   None     Immunization History  Administered Date(s) Administered  . Influenza,inj,Quad PF,6+ Mos 09/13/2019    Health Maintenance  Topic Date Due  . HIV Screening  08/10/2005  . TETANUS/TDAP  08/10/2009  . INFLUENZA VACCINE  05/08/2019     Discussed health benefits of physical activity, and encouraged him to engage in regular exercise appropriate for his age and condition.    --------------------------------------------------------------------  1. Annual physical exam Return to clinic 2 to 3  Years.  Overall patient in very good health.  - CBC w/Diff/Platelet - TSH - Lipid panel - Comprehensive Metabolic Panel (CMET)   2. Need for influenza vaccination  - Flu Vaccine QUAD 36+ mos IM    I,Jaiven Graveline,acting as a scribe for 13/01/2009, MD.,have documented all relevant documentation on the behalf of 07/08/2019, MD,as directed by  [name], MD while in the presence of [name], MD.   [name], MD  [facility]  [facility]  

## 2019-09-13 ENCOUNTER — Encounter: Payer: Self-pay | Admitting: Family Medicine

## 2019-09-13 ENCOUNTER — Ambulatory Visit (INDEPENDENT_AMBULATORY_CARE_PROVIDER_SITE_OTHER): Payer: Managed Care, Other (non HMO) | Admitting: Family Medicine

## 2019-09-13 ENCOUNTER — Other Ambulatory Visit: Payer: Self-pay

## 2019-09-13 VITALS — BP 127/73 | HR 70 | Temp 97.1°F | Resp 16 | Ht 73.0 in | Wt 218.0 lb

## 2019-09-13 DIAGNOSIS — Z0000 Encounter for general adult medical examination without abnormal findings: Secondary | ICD-10-CM | POA: Diagnosis not present

## 2019-09-13 DIAGNOSIS — Z23 Encounter for immunization: Secondary | ICD-10-CM | POA: Diagnosis not present

## 2020-01-08 DIAGNOSIS — Z20828 Contact with and (suspected) exposure to other viral communicable diseases: Secondary | ICD-10-CM | POA: Diagnosis not present

## 2020-02-12 ENCOUNTER — Encounter: Payer: Self-pay | Admitting: Emergency Medicine

## 2020-02-12 ENCOUNTER — Emergency Department
Admission: EM | Admit: 2020-02-12 | Discharge: 2020-02-13 | Disposition: A | Discharge status: Home / Self Care | Payer: No Typology Code available for payment source | Attending: Emergency Medicine | Admitting: Emergency Medicine

## 2020-02-12 ENCOUNTER — Other Ambulatory Visit: Payer: Self-pay

## 2020-02-12 ENCOUNTER — Emergency Department: Payer: No Typology Code available for payment source

## 2020-02-12 DIAGNOSIS — R519 Headache, unspecified: Secondary | ICD-10-CM | POA: Insufficient documentation

## 2020-02-12 DIAGNOSIS — S199XXA Unspecified injury of neck, initial encounter: Secondary | ICD-10-CM | POA: Diagnosis present

## 2020-02-12 DIAGNOSIS — M25511 Pain in right shoulder: Secondary | ICD-10-CM | POA: Diagnosis not present

## 2020-02-12 DIAGNOSIS — Y92410 Unspecified street and highway as the place of occurrence of the external cause: Secondary | ICD-10-CM | POA: Insufficient documentation

## 2020-02-12 DIAGNOSIS — Y990 Civilian activity done for income or pay: Secondary | ICD-10-CM | POA: Insufficient documentation

## 2020-02-12 DIAGNOSIS — S80211A Abrasion, right knee, initial encounter: Secondary | ICD-10-CM | POA: Insufficient documentation

## 2020-02-12 DIAGNOSIS — Y9389 Activity, other specified: Secondary | ICD-10-CM | POA: Insufficient documentation

## 2020-02-12 DIAGNOSIS — M25512 Pain in left shoulder: Secondary | ICD-10-CM | POA: Diagnosis not present

## 2020-02-12 DIAGNOSIS — S161XXA Strain of muscle, fascia and tendon at neck level, initial encounter: Secondary | ICD-10-CM | POA: Diagnosis not present

## 2020-02-12 DIAGNOSIS — R52 Pain, unspecified: Secondary | ICD-10-CM | POA: Diagnosis not present
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
## 2020-02-13 MED ORDER — CYCLOBENZAPRINE HCL 5 MG PO TABS: ORAL_TABLET | ORAL | 0 refills | Status: AC

## 2020-02-13 MED ORDER — IBUPROFEN 800 MG PO TABS: 800.0000 mg | ORAL_TABLET | Freq: Three times a day (TID) | ORAL | 0 refills | Status: AC | PRN

## 2020-02-13 NOTE — Discharge Instructions (Signed)
1.  You may take medicines as needed for pain and muscle relaxation (Motrin/Flexeril #15). 2.  Apply moist heat to affected area several times daily. 3.  Return to the ER for worsening symptoms, persistent vomiting, difficulty breathing or other concerns.

## 2020-03-17 ENCOUNTER — Telehealth: Payer: Self-pay | Admitting: Family Medicine

## 2020-03-17 NOTE — Telephone Encounter (Signed)
Copied from CRM [id number]. Topic: Medical Record Request - Provider/Facility Request >> Mar 17, 2020  4:31 PM Randol Kern wrote: Patient Name/DOB/MRN #: Nathan Norman / Jul 11, 1991 / [id number] Requestor Name/Agency: [employer] - Nurse Al Pimple. [place]  Call Back #: [phone] Information Requested: List of immunizations, pt would also like to know his blood type (if possible)    Route to [facility] for [place] clinics. For all other clinics, route to the clinic's PEC Pool.

## 2020-03-20 NOTE — Telephone Encounter (Signed)
Pt ask if we could mail his immunizations record to him.  He can pick it up but it would be Friday.  CB#  [phone]

## 2020-03-20 NOTE — Telephone Encounter (Signed)
[facility] record printed. We do not know pt's blood type.

## 2020-03-21 NOTE — Telephone Encounter (Signed)
Records have been placed at front office to be picked up by patient.

## 2020-03-23 ENCOUNTER — Telehealth: Payer: Self-pay

## 2020-03-23 NOTE — Telephone Encounter (Signed)
Copied from CRM [id number]. Topic: Medical Record Request - Provider/Facility Request >> Mar 17, 2020  4:31 PM Randol Kern wrote: Patient Name/DOB/MRN #: Vilma Prader B. Lovejoy / 01/27/1991 / [id number] Requestor Name/Agency: [employer] - Nurse Al Pimple. [place]  Call Back #: [phone] Information Requested: List of immunizations, pt would also like to know his blood type (if possible)    Route to [facility] for [place] clinics. For all other clinics, route to the clinic's PEC Pool. >> Mar 23, 2020  8:38 AM Molli Barrows, Rosey Bath wrote: Left message for pt to return call or for pt to come in the office and sign a release. Thanks TNP >> Mar 23, 2020  8:43 AM Deborha Payment wrote: Patient is out of town and is wondering if we can fax or email.  Call back [phone]

## 2020-06-14 DIAGNOSIS — Z20828 Contact with and (suspected) exposure to other viral communicable diseases: Secondary | ICD-10-CM | POA: Diagnosis not present
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
## 2020-07-31 ENCOUNTER — Telehealth: Payer: Self-pay | Admitting: *Deleted

## 2020-07-31 ENCOUNTER — Ambulatory Visit (INDEPENDENT_AMBULATORY_CARE_PROVIDER_SITE_OTHER): Payer: BC Managed Care – PPO | Admitting: Family Medicine

## 2020-07-31 ENCOUNTER — Other Ambulatory Visit: Payer: Self-pay

## 2020-07-31 ENCOUNTER — Encounter: Payer: Self-pay | Admitting: Family Medicine

## 2020-07-31 VITALS — BP 103/60 | HR 74 | Temp 98.7°F | Resp 16 | Ht 73.0 in | Wt 208.0 lb

## 2020-07-31 DIAGNOSIS — N453 Epididymo-orchitis: Secondary | ICD-10-CM | POA: Diagnosis not present

## 2020-07-31 DIAGNOSIS — N50811 Right testicular pain: Secondary | ICD-10-CM

## 2020-07-31 LAB — POCT URINALYSIS DIPSTICK
Bilirubin, UA: NEGATIVE
Blood, UA: NEGATIVE
Glucose, UA: NEGATIVE
Ketones, UA: NEGATIVE
Leukocytes, UA: NEGATIVE
Nitrite, UA: NEGATIVE
Protein, UA: NEGATIVE
Spec Grav, UA: 1.01 (ref 1.010–1.025)
Urobilinogen, UA: 0.2 E.U./dL
pH, UA: 7.5 (ref 5.0–8.0)

## 2020-07-31 MED ORDER — DOXYCYCLINE HYCLATE 100 MG PO TABS: 100.0000 mg | ORAL_TABLET | Freq: Two times a day (BID) | ORAL | 0 refills | Status: AC

## 2020-07-31 NOTE — Progress Notes (Signed)
I,[name],acting as a scribe for [name], MD.,have documented all relevant documentation on the behalf of [name], MD,as directed by  [name], MD while in the presence of [name], MD.  Established patient visit   Patient: Nathan Norman   DOB: 1991-02-16   29 y.o. Male  MRN: [id number] Visit Date: 07/31/2020  Today's healthcare provider: [name], MD   Chief Complaint  Patient presents with  . Testicle Pain   Subjective    HPI  Patient states had sore throat with pus pockets in the back of throat. Patient states his right testicle started hurting 2 days ago.  Child has foot and mouth and mouth disease. Patient has no dysuria and has had no known trauma to the testicle.  Sore throat is basically resolved.     Medications: Outpatient Medications Prior to Visit  Medication Sig  . ibuprofen (ADVIL) 800 MG tablet Take 1 tablet (800 mg total) by mouth every 8 (eight) hours as needed for moderate pain.  . cyclobenzaprine (FLEXERIL) 5 MG tablet 1 tablet every 8 hours as he did for muscle spasms (Patient not taking: Reported on 07/31/2020)   No facility-administered medications prior to visit.    Review of Systems  Constitutional: Negative for appetite change, chills and fever.  Respiratory: Negative for chest tightness, shortness of breath and wheezing.   Cardiovascular: Negative for chest pain and palpitations.  Gastrointestinal: Negative for abdominal pain, nausea and vomiting.       Objective    BP 103/60 (BP Location: Right Arm, Patient Position: Sitting, Cuff Size: Large)   Pulse 74     Physical Exam Vitals reviewed.  Constitutional:      Appearance: Normal appearance.  HENT:     Head: Normocephalic and atraumatic.     Right Ear: External ear normal.     Left Ear: Tympanic membrane and external ear normal.  Eyes:     General: No scleral icterus.    Conjunctiva/sclera: Conjunctivae normal.    Cardiovascular:     Rate and Rhythm: Normal rate and regular rhythm.     Pulses: Normal pulses.     Heart sounds: Normal heart sounds.  Pulmonary:     Effort: Pulmonary effort is normal.     Breath sounds: Normal breath sounds.  Abdominal:     Palpations: Abdomen is soft.  Genitourinary:    Penis: Normal.      Testes: Normal.  Lymphadenopathy:     Cervical: No cervical adenopathy.  Skin:    General: Skin is warm and dry.  Neurological:     General: No focal deficit present.     Mental Status: He is alert and oriented to person, place, and time.  Psychiatric:        Mood and Affect: Mood normal.        Behavior: Behavior normal.        Thought Content: Thought content normal.        Judgment: Judgment normal.       No results found for any visits on 07/31/20.  Assessment & Plan     1. Pain in right testicle Do not think this is testicular torsion. - POCT urinalysis dipstick  2. Orchitis and epididymitis Treat as  infection presently and will follow up as needed. - doxycycline (VIBRA-TABS) 100 MG tablet; Take 1 tablet (100 mg total) by mouth 2 (two) times daily.  Dispense: 10 tablet; Refill: 0   No  follow-ups on file.         Sae Handrich Cranford Mon, MD  [facility] [phone] (phone) [phone] (fax)  [place]

## 2020-07-31 NOTE — Telephone Encounter (Signed)
Copied from CRM [id number]. Topic: General - Other >> Jul 31, 2020  8:06 AM Wyonia Hough E wrote: Reason for CRM: Pt was triaged and told to make an appt today for testicle pain/ Pt mentioned a sore throat when asked the DT questions but stated he had the mouth portion from his child that had hand foot and mouth/ please advise if he can still come in office at 3:20 today. Please advise?

## 2020-08-02 NOTE — Telephone Encounter (Signed)
Patient was contacted and seen in office.

## 2020-10-28 DIAGNOSIS — Z20822 Contact with and (suspected) exposure to covid-19: Secondary | ICD-10-CM | POA: Diagnosis not present

## 2021-01-26 ENCOUNTER — Emergency Department
Admission: EM | Admit: 2021-01-26 | Discharge: 2021-01-26 | Disposition: A | Discharge status: Home / Self Care | Payer: No Typology Code available for payment source | Attending: Emergency Medicine | Admitting: Emergency Medicine

## 2021-01-26 ENCOUNTER — Other Ambulatory Visit: Payer: Self-pay

## 2021-01-26 DIAGNOSIS — Y92410 Unspecified street and highway as the place of occurrence of the external cause: Secondary | ICD-10-CM | POA: Diagnosis not present

## 2021-01-26 DIAGNOSIS — S50812A Abrasion of left forearm, initial encounter: Secondary | ICD-10-CM | POA: Insufficient documentation

## 2021-01-26 DIAGNOSIS — S60418A Abrasion of other finger, initial encounter: Secondary | ICD-10-CM | POA: Insufficient documentation

## 2021-01-26 DIAGNOSIS — S6992XA Unspecified injury of left wrist, hand and finger(s), initial encounter: Secondary | ICD-10-CM | POA: Diagnosis present

## 2021-01-26 DIAGNOSIS — S60512A Abrasion of left hand, initial encounter: Secondary | ICD-10-CM | POA: Insufficient documentation
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
## 2021-01-26 NOTE — ED Notes (Signed)
Pt verbalized understanding of d/c instructions at this time. Pt given opportunity to ask questions. Follow up care discussed at this time. Pt ambulatory to ED lobby, NAD noted, RR even and unlabored, steady gait noted.

## 2021-01-26 NOTE — ED Notes (Addendum)
Per Sergeant Monday, pt does not need to complete blood testing at this time, only UDS required.
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
## 2021-01-26 NOTE — ED Notes (Signed)
Workers comp completed by [employer] EDT

## 2022-08-21 ENCOUNTER — Other Ambulatory Visit: Payer: Self-pay

## 2022-09-09 LAB — HEPATITIS B SURFACE ANTIBODY,QUALITATIVE
